# Patient Record
Sex: Female | Born: 1965 | Race: Black or African American | Hispanic: No | Marital: Single | Smoking: Never smoker
Health system: Southern US, Community
[De-identification: ages and names within clinical notes are randomized; demographics above are authoritative.]

## PROBLEM LIST (undated history)

## (undated) ENCOUNTER — Ambulatory Visit (HOSPITAL_COMMUNITY): Admission: EM | Disposition: A | Discharge status: Other Acute Inpatient Hospital | Payer: Self-pay

## (undated) DIAGNOSIS — K219 Gastro-esophageal reflux disease without esophagitis: Secondary | ICD-10-CM

## (undated) HISTORY — PX: ABDOMINAL HYSTERECTOMY: SHX81

---

## 2005-02-07 ENCOUNTER — Emergency Department (HOSPITAL_COMMUNITY): Admission: EM | Admit: 2005-02-07 | Discharge: 2005-02-07 | Payer: Self-pay | Admitting: Family Medicine

## 2005-06-11 ENCOUNTER — Ambulatory Visit: Payer: Self-pay | Admitting: Family Medicine

## 2005-07-24 ENCOUNTER — Emergency Department (HOSPITAL_COMMUNITY): Admission: EM | Admit: 2005-07-24 | Discharge: 2005-07-24 | Payer: Self-pay | Admitting: Family Medicine

## 2005-07-30 ENCOUNTER — Emergency Department (HOSPITAL_COMMUNITY): Admission: EM | Admit: 2005-07-30 | Discharge: 2005-07-30 | Payer: Self-pay | Admitting: Emergency Medicine

## 2005-09-19 ENCOUNTER — Ambulatory Visit: Payer: Self-pay | Admitting: Family Medicine

## 2005-09-25 ENCOUNTER — Inpatient Hospital Stay (HOSPITAL_COMMUNITY): Admission: AD | Admit: 2005-09-25 | Discharge: 2005-09-25 | Payer: Self-pay | Admitting: Obstetrics and Gynecology

## 2005-10-09 ENCOUNTER — Emergency Department (HOSPITAL_COMMUNITY): Admission: EM | Admit: 2005-10-09 | Discharge: 2005-10-09 | Payer: Self-pay | Admitting: *Deleted

## 2005-10-16 ENCOUNTER — Ambulatory Visit: Payer: Self-pay | Admitting: Obstetrics and Gynecology

## 2005-11-15 ENCOUNTER — Emergency Department (HOSPITAL_COMMUNITY): Admission: EM | Admit: 2005-11-15 | Discharge: 2005-11-15 | Payer: Self-pay | Admitting: Family Medicine

## 2005-11-21 ENCOUNTER — Ambulatory Visit (HOSPITAL_COMMUNITY): Admission: RE | Admit: 2005-11-21 | Discharge: 2005-11-21 | Payer: Self-pay | Admitting: Orthopaedic Surgery

## 2005-11-26 ENCOUNTER — Ambulatory Visit: Payer: Self-pay | Admitting: Obstetrics and Gynecology

## 2005-11-26 ENCOUNTER — Inpatient Hospital Stay (HOSPITAL_COMMUNITY): Admission: RE | Admit: 2005-11-26 | Discharge: 2005-11-29 | Payer: Self-pay | Admitting: Obstetrics and Gynecology

## 2005-11-26 ENCOUNTER — Encounter (INDEPENDENT_AMBULATORY_CARE_PROVIDER_SITE_OTHER): Payer: Self-pay | Admitting: Specialist

## 2005-12-07 ENCOUNTER — Ambulatory Visit: Payer: Self-pay | Admitting: Obstetrics & Gynecology

## 2005-12-07 ENCOUNTER — Inpatient Hospital Stay (HOSPITAL_COMMUNITY): Admission: AD | Admit: 2005-12-07 | Discharge: 2005-12-07 | Payer: Self-pay | Admitting: Obstetrics & Gynecology

## 2005-12-11 ENCOUNTER — Ambulatory Visit: Payer: Self-pay | Admitting: Obstetrics and Gynecology

## 2005-12-26 ENCOUNTER — Ambulatory Visit: Payer: Self-pay | Admitting: Obstetrics & Gynecology

## 2006-02-16 ENCOUNTER — Emergency Department (HOSPITAL_COMMUNITY): Admission: EM | Admit: 2006-02-16 | Discharge: 2006-02-16 | Payer: Self-pay | Admitting: Family Medicine

## 2006-08-05 ENCOUNTER — Emergency Department (HOSPITAL_COMMUNITY): Admission: EM | Admit: 2006-08-05 | Discharge: 2006-08-05 | Payer: Self-pay | Admitting: Emergency Medicine

## 2006-09-12 ENCOUNTER — Emergency Department (HOSPITAL_COMMUNITY): Admission: EM | Admit: 2006-09-12 | Discharge: 2006-09-12 | Payer: Self-pay | Admitting: Emergency Medicine

## 2006-11-27 ENCOUNTER — Emergency Department (HOSPITAL_COMMUNITY): Admission: EM | Admit: 2006-11-27 | Discharge: 2006-11-27 | Payer: Self-pay | Admitting: Family Medicine

## 2006-12-11 ENCOUNTER — Emergency Department (HOSPITAL_COMMUNITY): Admission: EM | Admit: 2006-12-11 | Discharge: 2006-12-11 | Payer: Self-pay | Admitting: Emergency Medicine

## 2007-01-08 ENCOUNTER — Emergency Department (HOSPITAL_COMMUNITY): Admission: EM | Admit: 2007-01-08 | Discharge: 2007-01-08 | Payer: Self-pay | Admitting: Emergency Medicine

## 2007-01-16 ENCOUNTER — Emergency Department (HOSPITAL_COMMUNITY): Admission: EM | Admit: 2007-01-16 | Discharge: 2007-01-16 | Payer: Self-pay | Admitting: Family Medicine

## 2007-04-19 ENCOUNTER — Emergency Department (HOSPITAL_COMMUNITY): Admission: EM | Admit: 2007-04-19 | Discharge: 2007-04-19 | Payer: Self-pay | Admitting: Family Medicine

## 2007-09-05 ENCOUNTER — Emergency Department (HOSPITAL_COMMUNITY): Admission: EM | Admit: 2007-09-05 | Discharge: 2007-09-05 | Payer: Self-pay | Admitting: Family Medicine

## 2011-03-02 NOTE — Op Note (Signed)
NAMESONNY, POTH              ACCOUNT NO.:  [id number]   MEDICAL RECORD NO.:  [id number]          PATIENT TYPE:  AMB   LOCATION:  SDS                          FACILITY:  [facility]   PHYSICIAN:  Vanita Panda. Magnus Ivan, M.D.DATE OF BIRTH:  1966-02-25   DATE OF PROCEDURE:  11/21/2005  DATE OF DISCHARGE:  11/21/2005                                 OPERATIVE REPORT   PREOPERATIVE DIAGNOSIS:  Right thumb flexor pollicis longus (FPL) tendon  laceration with questionable nerve injury.   POSTOPERATIVE DIAGNOSIS:  Right thumb flexor pollicis longus tendon  laceration.   PROCEDURE:  Right thumb wound exploration with direct primary repair of  right thumb flexor pollicis longus tendon in zone II.   SURGEON:  Vanita Panda. Magnus Ivan, M.D.   ANESTHESIA:  General.   ANTIBIOTICS:  One gram IV Ancef.   TOURNIQUET TIME:  One hour 27 minutes.   COMPLICATIONS:  None.   INDICATIONS:  Briefly, Ms. Vernie Shanks is a 39-year right-hand-dominant female  who was going to change a light bulb when the light fixture itself broke in  her hand and she sustained a transverse laceration across her thumb between  the MCP and IP joint of the thumb.  She had the inability to flex her thumb,  but she could extend it.  She reported numbness in her thumb tip.  The thumb  tip itself was well-perfused.  The wound was irrigated by the ER staff and  the wound was closed and she was seen in my clinic a couple of days later.  I suspected an FPL tendon injury, which was a complete injury, and possible  nerve injury and thus I set her up for today's surgery.  The risks and  benefits of this were well- explained to her and understood.  She agreed to  proceed with surgery.   PROCEDURE:  After informed consent was obtained, the appropriate right arm  was marked.  Ms. Vernie Shanks was brought to the operating room and placed supine  on the operating table.  General anesthesia was then obtained.  A nonsterile  tourniquet was placed  around her upper arm and then her arm was prepped and  draped with DuraPrep and sterile drapes.  Previous sutures on the thumb were  removed.  I then opened a small transverse incision and found that the FPL  tendon was completely ruptured in a zigzag fashion.  I extended the incision  proximally and distally and especially had to extend the incision into the  palm.  At that point I was able to use a tendon grasper and locate the FPL  tendon within its tendon sheath that had retracted into the palm area.  I  was able to pull this section out and run it through the A1 and A2 pulleys  and I then put a core suture consisting of 3-0 Ethibond suture into the  proximal end of the FPL tendon.  Next, the distal end was dissected out and  it was found to be intact coming out of the A4 pulley.  I put an additional  3-0 Ethibond suture in this end and  then was able to perform a direct  primary repair of the FPL tendon with the 3-0 core sutures.  I next  oversewed the epitendinous aspect of the tendon with interrupted 6-0 Prolene  suture.  The tendon was able to glide through its complete flexion without  interruption.  I next explored the wound and found the artery and vein to be  intact on both sides and the nerve intact as well.  The wound was then  copiously irrigated and the incisions were closed with interrupted 3-0  Prolene suture in the skin.  Of note, an Esmarch had been used to wrap out  the arm and the tourniquet was previously inflated to 250 mm of pressure.  I  let the tourniquet down and hemostasis was obtained and the thumb did pink-  up nicely.  A well-padded plaster splint was then placed on the thumb to  limit motion and the wrist slightly flexed as well as take tension off of  the repair.  The patient was awakened, extubated and taken to the recovery  room in stable condition.           ______________________________  Vanita Panda. Magnus Ivan, M.D.     CYB/MEDQ  D:  11/21/2005   T:  11/22/2005  Job:  [id number]

## 2011-03-02 NOTE — Group Therapy Note (Signed)
NAME:  [name], [name] NO.:  [id number]   MEDICAL RECORD NO.:  [id number]          PATIENT TYPE:  WOC   LOCATION:  [facility]                   FACILITY:  [facility]   PHYSICIAN:  Argentina Donovan, MD        DATE OF BIRTH:  02-Mar-1966   DATE OF SERVICE:                                    CLINIC NOTE   The patient is a 45 year old black female, gravida 4, para 4-0-0-4 the  youngest being 42, who underwent a myomectomy in 1996 and did fairly well  after that.  However, she has had severe low abdominal pain continually with  increasing dysmenorrhea radiating to the upper thighs and back at the time  of her period.  She has some small fibroids and her uterus is slightly  enlarged, but for the most part it is soft, boggy, and gives the impression  that patient probably has adenomyosis.  She was seen in the in the [facility]  recently and was anemic, but not treated, and at [facility] had a positive  chlamydia, for which she states is not treated.  So, today we are going to  put her on iron, have her take Ibuprofen for the pain and put her  Erythromycin for the chlamydia.   PAST HISTORY:  The myomectomy and a cervical disc surgery.  She uses condoms  for contraception.  She is a non-smoker.  She goes to [place] to a pain  clinic to get medication for her neurological pain from the neck surgery,  and they give her OxyContin there.  Apparently, she has written for the  records, but has not been able to receive them as yet.  Other than that, she  is in good health.   IMPRESSION:  1.  Chronic pelvic pain.  2.  Uterine fibroids.  3.  Probable adenomyosis with secondary menometrorrhagia and anemia.           ______________________________  Argentina Donovan, MD     PR/MEDQ  D:  10/16/2005  T:  10/16/2005  Job:  [id number]

## 2011-03-02 NOTE — Group Therapy Note (Signed)
NAMESURY, WENTWORTH NO.:  [id number]   MEDICAL RECORD NO.:  [id number]          PATIENT TYPE:  WOC   [facility]ATION:  [facility]                   FACILITY[facility]  [facility]   PHYSICIAN:  Argentina Donovan, MD        DATE OF BIRTH:  21-Dec-1965   DATE OF SERVICE:                                    CLINIC NOTE   The patient is a 45 year old black female, who underwent a total abdominal  hysterectomy and right salpingo-oophorectomy on November 26, 2005.  She had  a non-significant postoperative course and was seen today for her routine  postoperative checkup.  She has no significant complaints except for some  abdominal gas and we have encouraged her to use some activated charcoal  capsules.  She has a soft, flat, nontender abdomen with a transverse  suprapubic scar that is barely visible at this point and she has no other  positive findings.  We are going to see her back in 2 weeks.  She did go on  Periactin because of an unintentional weight loss and no appetite.  She said  this seems to be working.   DIAGNOSES:  Postoperative 2-week visit post total abdominal hysterectomy and  right salpingo-oophorectomy, satisfactory healing.           ______________________________  Argentina Donovan, MD     PR/MEDQ  D:  12/11/2005  T:  12/11/2005  Job:  [id number]

## 2011-03-02 NOTE — Group Therapy Note (Signed)
NAME:  Catherine, [name] NO.:  [id number]   MEDICAL RECORD NO.:  [id number]          PATIENT TYPE:  WOC   [facility]ATION:  [facility]                   [facility]ACILITY:  [facility]   PHYSICIAN:  Kathlyn Sacramento, M.D.   DATE OF BIRTH:  [birth year]   DATE OF SERVICE:                                    CLINIC NOTE   CHIEF COMPLAINT:  Followup from surgery and followup for unintentional  weight loss.   HISTORY OF PRESENT ILLNESS:  The patient is a 45 year old African-American  female, who had a total abdominal hysterectomy and right salpingo-  oophorectomy on November 26, 2005.  She stated that she had had some  unintentional weight loss and had been placed Periactin for 1 month.  She  said the Periactin worked for 1 month, but since she has stopped the  prescription, she has lost her appetite again.  She does state that she has  a history of depression and she does have a depressed mood.  She had been on  Effexor, Risperdal and Lunesta in the past when she was in [place], but has  not been on them recently.  She stopped them in [place] because of  transportation problems and has not reestablished herself with a  psychiatrist here in [place].  She does state that she is having no pain  or bleeding after the surgery.   PHYSICAL EXAMINATION:  VITAL SIGNS: Temp 98.6, pulse 102, blood pressure  111/61, weight 149.2 and height 5 feet 7 inches.  GENERAL:  A well-developed, well-nourished female in no acute distress.   IMPRESSION:  1.  Depression.  2.  Status post transabdominal hysterectomy with right salpingo-      oophorectomy.   PLAN:  1.  We will have patient referred to behavioral health.  2.  Stable, post surgery.  3.  The patient can follow up as needed.           ______________________________  Kathlyn Sacramento, M.D.     AC/MEDQ  D:  12/26/2005  T:  12/27/2005  Job:  [id number]

## 2011-03-02 NOTE — Discharge Summary (Signed)
[name], [name]              ACCOUNT NO.:  [id number]   MEDICAL RECORD NO.:  [id number]          PATIENT TYPE:  INP   LOCATION:  9318                          [facility]   PHYSICIAN:  Phil D. Okey Dupre, M.D.     DATE OF BIRTH:  02/20/1966   DATE OF ADMISSION:  11/26/2005  DATE OF DISCHARGE:                                 DISCHARGE SUMMARY   The patient, a 45 year old black female, with intractable menometrorrhagia  and pelvic pain, pelvic adhesions, underwent total abdominal hysterectomy  and right salpingo-oophorectomy on the day of admission. Has done extremely  well postoperatively except short episode of acute urinary retention on the  first postoperative day which has resolved. She also had not had a bowel  movement in several days and has since yesterday succeeded in that and feels  very well. She has had a completely afebrile postoperative course. Her  hemoglobin is stable at 8.9. She received 2 units of blood during her  hospitalization, one during surgery and one postoperatively in the recovery  room because of the low hemoglobin at the beginning of the surgery. The  patient has a physical examination without significant abnormal findings.  The wound is clean. The abdomen is soft, flat, nontender. No masses, no  organomegaly. The skin staples were removed before discharge and the patient  with get follow-up in the GYN clinic in 2 weeks. She had been given strict  discharge instructions as to activity, diet and follow-up, and is being  discharged on Percocet, Motrin, Slow Fe for iron replacement, Periactin as  an appetite stimulant.   DISCHARGE DIAGNOSIS:  Pending pathology report which has not returned.           ______________________________  Javier Glazier Okey Dupre, M.D.     PDR/MEDQ  D:  11/29/2005  T:  11/29/2005  Job:  [id number]

## 2011-03-02 NOTE — Op Note (Signed)
NAMEHAGAR, [name]              ACCOUNT NO.:  [id number]   MEDICAL RECORD NO.:  [id number]          PATIENT TYPE:  INP   LOCATION:  9318                          [facility]   PHYSICIAN:  Phil D. Okey Dupre, M.D.     DATE OF BIRTH:  1966/08/06   DATE OF PROCEDURE:  11/26/2005  DATE OF DISCHARGE:                                 OPERATIVE REPORT   PROCEDURE:  Total abdominal hysterectomy and right salpingo-oophorectomy.   SURGEON:  Javier Glazier. Okey Dupre, M.D.   FIRST ASSISTANT:  Shelbie Proctor. Shawnie Pons, M.D.   ESTIMATED BLOOD LOSS:  500 mL.   ANESTHESIA:  General   PREOPERATIVE DIAGNOSES:  1.  Chronic pelvic pain.  2.  Secondary anemia.  3.  Pelvic adhesions.  4.  Dysmenorrhea.  5.  Menometrorrhagia.   POSTOPERATIVE DIAGNOSES:  1.  Chronic pelvic pain.  2.  Secondary anemia.  3.  Pelvic adhesions.  4.  Dysmenorrhea.  5.  Menometrorrhagia.   OPERATIVE FINDINGS:  Uterus was of normal size, shape, somewhat soft in  consistency with marked adhesions of the sigmoid colon to the cul-de-sac and  the area between the uterosacral ligaments.  Also significant adhesions in  the area of the right adnexa with a hemorrhagic right ovarian cyst.  The  left ovary was normal.   DESCRIPTION OF PROCEDURE:  Under satisfactory general anesthesia, with the  patient in the dorsal supine position and Foley catheter in the urinary  bladder, the abdomen was prepped and draped in the usual sterile manner and  entered through a Pfannenstiel incision situated 3 cm above the symphysis  pubis and extending for a total length of 14 cm.  The abdomen was entered by  layers entering the peritoneal cavity.  The above findings were noted.  Sharp dissection was used to dissect the colon away from the posterior wall  of the uterus as well as in between the uterosacral ligaments.  Sharp and  blunt dissection was used to free up the  right adnexa and a few other  adhesions were broken up by blunt dissection and coming through  the  subcutaneous tissue the first doing noticed was there was a significant  amount of oozing.  The uterus was held up with straight Kocher clamps and  were placed across the utero-ovarian ligament down to the round ligament.  The round ligaments were then ligated with 1 chromic catgut sutures dividing  the anterior leaf of broad ligament opened parallel to the uterus and  extending around the anterior superior portion of the cervix.  The bladder  was pushed from the lower portion of the cervix by sharp and blunt  dissection.  Opening was made in the avascular portion of the broad ligament  through which the tie was tied around the utero-ovarian ligament medial to  the ovary and a curved Haney clamp then placed across the pedicle.  The  tissue medially divided and the lateral pedicle religated with 1 chromic  catgut suture ligature.  Uterine vessels were then skeletonized, doubly  clamped, divided and doubly ligated with 1 chromic catgut suture.  The  ligature cardinal was clamped with a straight Kocher clamp, divided and  ligated with 1 chromic catgut suture ligature.  Curved Heaney clamps were  placed across the apex of the vagina just below the cervix which was  dissected away from the apex of the vagina by sharp dissection.  Angle  sutures of 1 chromic catgut were used in each of the lateral vaginal cuff  angles and figure-of-eight used to close the vaginal cuff.  There was  continuous oozing and marked bleeding from the very small pumpers in the  area of the left uterine vessel pedicle which were controlled with hot  cautery or small hemostats with small ties of 3-0 chromic catgut.  Hot  cautery was also used just at the apex of the bladder where there was marked  oozing.  Re-examining the right adnexa because of the marked amount of  scarring and the diagnosis of pelvic pain and adhesions, we decided to  remove the ovary and tube.  The infundibulopelvic ligament was clamped with  a  Heaney clamp, tissue, divided and removed and the lateral pedicle ligated  0 chromic catgut suture ligature.  Small oozing areas were then still  controlled around the vaginal cuff with hot cautery. The area was irrigated.  Upper abdominal viscera was explored and found to be within normal limits.  There was a significant amount of oozing on the surface of the rectus  muscles which was controlled with hot cautery, and the fascia closed with  continuous running 0 Vicryl on an atraumatic needle.  Subcutaneous bleeders  were once again were controlled with hot cautery as there as were many  obvious at this time.  Skin edge approximation with skin staples and a  pressure dressing applied.           ______________________________  Javier Glazier Okey Dupre, M.D.     PDR/MEDQ  D:  11/26/2005  T:  11/26/2005  Job:  [id number]

## 2011-03-02 NOTE — H&P (Signed)
[name], [name]              ACCOUNT NO.:  [id number]   MEDICAL RECORD NO.:  [id number]          PATIENT TYPE:  AMB   LOCATION:  SDS                          FACILITY:  [facility]   PHYSICIAN:  Phil D. Okey Dupre, M.D.     DATE OF BIRTH:  22-Sep-1966   DATE OF ADMISSION:  11/26/2005  DATE OF DISCHARGE:                                HISTORY & PHYSICAL   CHIEF COMPLAINT:  Continual severe abdominal pain with heavy periods.   HISTORY OF PRESENT ILLNESS:  The patient is a 45 year old black female  gravida 4, para 4-0-0-4, youngest child being 38 years old, who underwent a  myomectomy in 1996 and did fairly well after that. However, she has had  severe lower abdominal pain, continually, with increasing dysmenorrhea,  radiating to the upper thighs and back, and heavy periods. She has some  small fibroids in the uterus.  It is slightly enlarged, but for the most  part, it is soft and boggy and gives the impression that the patient has  probably had no myosis. She was seen in the [facility]  recently and was very anemic but not treated. When she was seen in the GYN  clinic, we started her on iron therapy. The patient is being admitted for  total abdominal hysterectomy. We have had a long consult with the patient  today, discussing the possible complications, especially those related to  anesthesia, injury to bowel or urinary tract, especially since she has had  an abdominal procedure previously, and we discussed postoperative hemorrhage  as well as infection. We discussed the followup care after the surgery and  restrictions after she went home.   PAST MEDICAL HISTORY:  She had myomectomy in 1996. She has had 4 vaginal  deliveries. She had 2 cervical disks removed.   SOCIAL HISTORY:  She is a nonsmoker. She takes no illicit drugs and she does  not drink alcohol.   ALLERGIES:  NO KNOWN DRUG ALLERGIES.   MEDICATIONS:  Tylenol p.r.n., occasional oxycodone for her back pain and  neck pain.   FAMILY HISTORY:  Diabetes in her mother. Her mother has high blood pressure,  and her mother's sister had a heart attack.   REVIEW OF SYSTEMS:  Negative except as in history of present illness and the  pain she has secondary to her disk surgery, although she has a vague history  of asthma and occasionally she has had episodes of shortness of breath. Also  has had a history of bruising in the past, but all of her blood work up to  this point has been normal.   PHYSICAL EXAMINATION:  VITAL SIGNS:  Blood pressure 114/62, pulse is 94,  temperature 98.4. Respiratory rate 18 per minute. The patient weighs 155  pounds, and is 5 feet 7 inches tall.  GENERAL:  Well-developed, well-nourished black female in no acute distress.  HEENT:  Within normal limits. Pupils are equal, round, and reactive to light  and accommodation.  NECK:  Supple with no dominant masses. Thyroid is symmetrical.  BACK:  Erect.  BREAST:  Symmetrical with no dominant masses  and no nipple discharge.  LUNGS:  Clear to auscultation and percussion.  HEART:  No murmur. Normal sinus rhythm.  ABDOMEN:  Soft, flat, nontender. No masses or organomegaly.  EXTREMITIES:  Deep tendon reflexes normal. No edema. No varices.  GENITOURINARY:  External genitals normal. Introitus marital. BUS within  normal limits. Vagina clean and well rugated. Cervix is clean and parous.  Uterus is upper limits of normal, boggy in consistency. Somewhat irregular  in shape. The adnexa, no masses noted.  RECTAL:  No masses noted.   IMPRESSION:  Severe disabling dysmenorrhea with menorrhagia and secondary  anemia as well as chronic pelvic pain, uterine fibroids, probable  adenomyosis.   PLAN:  Total abdominal hysterectomy.           ______________________________  Javier Glazier Okey Dupre, M.D.     PDR/MEDQ  D:  11/21/2005  T:  11/21/2005  Job:  [id number]

## 2012-01-09 ENCOUNTER — Emergency Department (HOSPITAL_COMMUNITY)
Admission: EM | Admit: 2012-01-09 | Discharge: 2012-01-09 | Disposition: A | Discharge status: Home / Self Care | Payer: Self-pay | Attending: Emergency Medicine | Admitting: Emergency Medicine

## 2012-01-09 ENCOUNTER — Encounter (HOSPITAL_COMMUNITY): Payer: Self-pay | Admitting: Emergency Medicine

## 2012-01-09 DIAGNOSIS — R0602 Shortness of breath: Secondary | ICD-10-CM | POA: Insufficient documentation

## 2012-01-09 DIAGNOSIS — J069 Acute upper respiratory infection, unspecified: Secondary | ICD-10-CM | POA: Insufficient documentation

## 2012-01-09 HISTORY — DX: Gastro-esophageal reflux disease without esophagitis: K21.9

## 2012-01-09 MED ORDER — ALBUTEROL SULFATE (5 MG/ML) 0.5% IN NEBU
5.0000 mg | INHALATION_SOLUTION | Freq: Once | RESPIRATORY_TRACT | Status: AC
  Administered 2012-01-09: 5 mg via RESPIRATORY_TRACT
  Filled 2012-01-09: qty 1

## 2012-01-09 MED ORDER — IPRATROPIUM BROMIDE 0.02 % IN SOLN
0.5000 mg | Freq: Once | RESPIRATORY_TRACT | Status: AC
  Administered 2012-01-09: 0.5 mg via RESPIRATORY_TRACT
  Filled 2012-01-09: qty 2.5

## 2012-01-09 MED ORDER — ALBUTEROL SULFATE HFA 108 (90 BASE) MCG/ACT IN AERS
2.0000 | INHALATION_SPRAY | RESPIRATORY_TRACT | Status: AC
  Administered 2012-01-09: 2 via RESPIRATORY_TRACT
  Filled 2012-01-09: qty 6.7

## 2012-01-09 NOTE — ED Provider Notes (Signed)
History     CSN: [id number]  Arrival date & time 01/09/12  2130   First MD Initiated Contact with Patient 01/09/12 0805      Chief Complaint  Patient presents with  . Shortness of Breath    HPI Pt was seen at 0810.  Per pt, c/o gradual onset and persistence of constant cough, wheezing and SOB for the past 4 days.  Has been assoc with runny/stuffy nose, sinus and ears congestion.  +multiple others in household with same symptoms.  Pt has been using her home nebs with intermittent relief.  Denies fevers, no abd pain, no back pain, no N/V/D, no rash.    Past Medical History  Diagnosis Date  . Asthma   . GERD (gastroesophageal reflux disease)     Past Surgical History  Procedure Date  . Abdominal hysterectomy      History  Substance Use Topics  . Smoking status: Never Smoker   . Smokeless tobacco: Not on file  . Alcohol Use: No    Review of Systems ROS: Statement: All systems negative except as marked or noted in the HPI; Constitutional: Negative for fever and chills. ; ; Eyes: Negative for eye pain, redness and discharge. ; ; ENMT: Negative for ear pain, hoarseness, sore throat.  +rhinorrhea, nasal congestion, sinus pressure. ; ; Cardiovascular: Negative for chest pain, palpitations, diaphoresis, dyspnea and peripheral edema. ; ; Respiratory: +cough, SOB, "wheezing."  Negative for stridor. ; ; Gastrointestinal: Negative for nausea, vomiting, diarrhea, abdominal pain, blood in stool, hematemesis, jaundice and rectal bleeding. . ; ; Genitourinary: Negative for dysuria, flank pain and hematuria. ; ; Musculoskeletal: Negative for back pain and neck pain. Negative for swelling and trauma.; ; Skin: Negative for pruritus, rash, abrasions, blisters, bruising and skin lesion.; ; Neuro: Negative for headache, lightheadedness and neck stiffness. Negative for weakness, altered level of consciousness , altered mental status, extremity weakness, paresthesias, involuntary movement, seizure and  syncope.     Allergies  Review of patient's allergies indicates no known allergies.  Home Medications  No current outpatient prescriptions on file.  BP 113/52  Pulse 91  Temp(Src) 98.6 F (37 C) (Oral)  Resp 20  SpO2 100%  Physical Exam 0815: Physical examination:  Nursing notes reviewed; Vital signs and O2 SAT reviewed;  Constitutional: Well developed, Well nourished, Well hydrated, In no acute distress; Head:  Normocephalic, atraumatic; Eyes: EOMI, PERRL, No scleral icterus; ENMT: +clear fluid levels behind TM's bilat, +edemetous nasal turbinates bilat with clear rhinorrhea , Mouth and pharynx normal, Mucous membranes moist; Neck: Supple, Full range of motion, No lymphadenopathy; Cardiovascular: Regular rate and rhythm, No murmur or gallop; Respiratory: Breath sounds coarse & equal bilaterally, No wheezes, Speaking full sentences with ease, no retrax or access mm use.  Normal respiratory effort/excursion; Chest: Nontender, Movement normal; Abdomen: Soft, Nontender, Nondistended, Normal bowel sounds; Extremities: Pulses normal, No tenderness, No edema, No calf edema or asymmetry.; Neuro: AA&Ox3, Major CN grossly intact.  No gross focal motor or sensory deficits in extremities.; Skin: Color normal, Warm, Dry, no rash.    ED Course  Procedures   MDM  MDM Reviewed: nursing note and vitals     8:19 AM:  Pt with hx asthma, does not smoke.  Appears URI today; endorses multiple others in house with URI symptoms.  Lungs coarse today, but no wheezing, no resp distress.  Refuses dose of MDI here, requesting "a nebulizer treatment because I know my body."  VSS, Sats 100% R/A.  Will dose neb  and re-assess.  8:52 AM:  Feels improved after neb and wants to go home now.  Lungs CTA, resps easy, Sats remain 100% R/A.  Dx d/w pt.  Questions answered.  Verb understanding, agreeable to d/c home with outpt f/u.         Laray Anger, DO 01/10/12 1827

## 2012-01-09 NOTE — ED Notes (Signed)
Pt reports chest tightness, SOB and wheezing for the last week. Pt afebrile. Denies pain at present. States overnight need for frequent use of inhaler

## 2012-01-09 NOTE — Discharge Instructions (Signed)
RESOURCE GUIDE  Dental Problems  Patients with Medicaid: [facility]                     [facility] [address]                                           [address] Phone:  [phone]                                                  Phone:  [phone]  If unable to pay or uninsured, contact:  [facility] or [facility] to become qualified for the adult dental clinic.  Chronic Pain Problems Contact [facility]  [phone] Patients need to be referred by their primary care doctor.  Insufficient Money for Medicine Contact [employer]:  call "211" or [facility] 271-5999.  No Primary Care Doctor Call [facility]  832-8000 Other agencies that provide inexpensive medical care    [facility]  [phone]    [facility]  [phone]    [facility]  [phone]    [facility]  [phone]    [facility]  [phone]    [facility]  [phone]  Psychological Services [facility]  [phone] [facility]  [phone] [facility]   800 853-5163 (emergency services [phone])  Substance Abuse Resources [facility]  336-882-2125 [facility] 336-784-9470 [facility] 336-285-9073 [facility] 336-845-3988 Residential & Outpatient Substance Abuse Program  800-659-3381  Abuse/Neglect [place] Child Abuse Hotline (336) 641-3795 [place] [facility] Abuse Hotline 800-378-5315 (After Hours)  Emergency Shelter [facility] (336) 271-5985  [facility] (336) 275-9566 [facility] (704) 372-4663  MRSA Hotline #:   832-7006    [place] [facility]  [facility] [facility]     [employer]                          [facility] [address] [place]                       [address]      [address]  [place]                                                [place]                            [place] Phone:  [phone]                                   Phone:  [phone]                 Phone:  [phone]  [facility] Phone:  [phone]    [place] Child Abuse Hotline (336) 342-1394 (336) 342-3537 (After Hours)    Take over the counter sudafed, as directed on packaging, for the next week.  Use over the counter normal saline nasal spray, as instructed in the Emergency Department, several times per day for the next 2 weeks.  Use the albuterol inhaler:  2 to 4 puffs every 4 hours for the next 7 days.  Call your regular medical doctor today to schedule a follow up appointment this week.  Return to the Emergency Department immediately if worsening.  

## 2013-04-22 ENCOUNTER — Other Ambulatory Visit: Payer: Self-pay

## 2018-07-26 ENCOUNTER — Ambulatory Visit (HOSPITAL_COMMUNITY)
Admission: EM | Admit: 2018-07-26 | Discharge: 2018-07-26 | Disposition: A | Discharge status: Home / Self Care | Payer: Worker's Compensation | Attending: Family Medicine | Admitting: Family Medicine

## 2018-07-26 DIAGNOSIS — W57XXXA Bitten or stung by nonvenomous insect and other nonvenomous arthropods, initial encounter: Secondary | ICD-10-CM

## 2018-07-26 DIAGNOSIS — S50861A Insect bite (nonvenomous) of right forearm, initial encounter: Secondary | ICD-10-CM

## 2018-07-26 MED ORDER — TRIAMCINOLONE ACETONIDE 0.1 % EX CREA: 1.0000 | TOPICAL_CREAM | Freq: Two times a day (BID) | CUTANEOUS | 0 refills | Status: DC

## 2018-07-26 NOTE — Discharge Instructions (Addendum)
You may take Benadryl as needed if not working.

## 2018-07-26 NOTE — ED Triage Notes (Signed)
pt states she was working and felt something biting her on her right forearm. Some type of insect bite.

## 2018-07-28 NOTE — ED Provider Notes (Signed)
[facility]   [id number] 07/26/18 Arrival Time: 1741  ASSESSMENT & PLAN:  1. Insect bite of right forearm, initial encounter     Meds ordered this encounter  Medications  . triamcinolone cream (KENALOG) 0.1 %    Sig: Apply 1 application topically 2 (two) times daily.    Dispense:  30 g    Refill:  0   No sign of infection. Discussed. Will follow up with PCP or here if worsening or failing to improve as anticipated. Reviewed expectations re: course of current medical issues. Questions answered. Outlined signs and symptoms indicating need for more acute intervention. Patient verbalized understanding. After Visit Summary given.   SUBJECTIVE:  [name] is a 52 y.o. female who presents with a skin complaint.   Location: R forearm; "small bumps" Onset: abrupt Duration: today; "felt something bite my arm at work today" Associated pruritis? mild Associated pain? none Progression: stable  Drainage? No  Known trigger? questions insect bite New soaps/lotions/topicals/detergents/environmental exposures? No Contacts with similar? No Recent travel? No  Other associated symptoms: none Therapies tried thus far: none Arthralgia or myalgia? none Recent illness? none Fever? none No specific aggravating or alleviating factors reported.  ROS: As per HPI.  OBJECTIVE: Vitals:   07/26/18 1819 07/26/18 1821  BP: 116/66   Pulse: 88   Resp: 18   Temp: 97.8 F (36.6 C)   SpO2: 100%   Weight:  85.7 kg    General appearance: alert; no distress Lungs: clear to auscultation bilaterally Heart: regular rate and rhythm Extremities: no edema Skin: warm and dry; signs of infection: no; over R elbow are a few solitary erythematous nodules, each <1cm in size; non-tender; without fluctuance; elbow with FROM and without swelling Psychological: alert and cooperative; normal mood and affect  No Known Allergies  Past Medical History:  Diagnosis Date  . Asthma   . GERD  (gastroesophageal reflux disease)    Social History   Socioeconomic History  . Marital status: Single    Spouse name: Not on file  . Number of children: Not on file  . Years of education: Not on file  . Highest education level: Not on file  Occupational History  . Not on file  Social Needs  . Financial resource strain: Not on file  . Food insecurity:    Worry: Not on file    Inability: Not on file  . Transportation needs:    Medical: Not on file    Non-medical: Not on file  Tobacco Use  . Smoking status: Never Smoker  Substance and Sexual Activity  . Alcohol use: No  . Drug use: No  . Sexual activity: Never  Lifestyle  . Physical activity:    Days per week: Not on file    Minutes per session: Not on file  . Stress: Not on file  Relationships  . Social connections:    Talks on phone: Not on file    Gets together: Not on file    Attends religious service: Not on file    Active member of club or organization: Not on file    Attends meetings of clubs or organizations: Not on file    Relationship status: Not on file  . Intimate partner violence:    Fear of current or ex partner: Not on file    Emotionally abused: Not on file    Physically abused: Not on file    Forced sexual activity: Not on file  Other Topics Concern  .  Not on file  Social History Narrative  . Not on file   No family history on file. Past Surgical History:  Procedure Laterality Date  . ABDOMINAL HYSTERECTOMY       Mardella Layman, MD 07/28/18 [phone]

## 2018-12-03 ENCOUNTER — Other Ambulatory Visit: Payer: Self-pay

## 2018-12-03 ENCOUNTER — Ambulatory Visit (HOSPITAL_COMMUNITY)
Admission: EM | Admit: 2018-12-03 | Discharge: 2018-12-03 | Disposition: A | Discharge status: Home / Self Care | Payer: Self-pay | Attending: Family Medicine | Admitting: Family Medicine

## 2018-12-03 ENCOUNTER — Encounter (HOSPITAL_COMMUNITY): Payer: Self-pay

## 2018-12-03 DIAGNOSIS — J40 Bronchitis, not specified as acute or chronic: Secondary | ICD-10-CM

## 2018-12-03 MED ORDER — PREDNISONE 10 MG PO TABS: 40.0000 mg | ORAL_TABLET | Freq: Every day | ORAL | 0 refills | Status: AC

## 2018-12-03 MED ORDER — BENZONATATE 100 MG PO CAPS: 100.0000 mg | ORAL_CAPSULE | Freq: Three times a day (TID) | ORAL | 0 refills | Status: DC

## 2018-12-03 NOTE — ED Provider Notes (Signed)
[facility]    CSN: [id number] Arrival date & time: 12/03/18  0850     History   Chief Complaint Chief Complaint  Patient presents with  . Asthma    HPI [name] is a 53 y.o. female.   Patient is a 53 year old female presents with 2 days of cough, congestion, body aches.  Her symptoms of been constant and worsening with increased wheezing.  She has been using her nebulizer machine for her symptoms.  She is not to take anything for cough.  Reports that she has had multiple sick contacts at work.  Denies any chest pain, shortness of breath.  No recent traveling. Last breathing treatment this am. She does not smoke.   ROS per HPI      Past Medical History:  Diagnosis Date  . Asthma   . GERD (gastroesophageal reflux disease)     There are no active problems to display for this patient.   Past Surgical History:  Procedure Laterality Date  . ABDOMINAL HYSTERECTOMY      OB History   No obstetric history on file.      Home Medications    Prior to Admission medications   Medication Sig Start Date End Date Taking? Authorizing Provider  albuterol (PROVENTIL) (2.5 MG/3ML) 0.083% nebulizer solution Take 2.5 mg by nebulization every 4 (four) hours as needed. For shortness of breath    [provider]  benzonatate (TESSALON) 100 MG capsule Take 1 capsule (100 mg total) by mouth every 8 (eight) hours. 12/03/18   Dahlia Byes A, NP  predniSONE (DELTASONE) 10 MG tablet Take 4 tablets (40 mg total) by mouth daily for 5 days. 12/03/18 12/08/18  Dahlia Byes A, NP  triamcinolone cream (KENALOG) 0.1 % Apply 1 application topically 2 (two) times daily. 07/26/18   Mardella Layman, MD    Family History No family history on file.  Social History Social History   Tobacco Use  . Smoking status: Never Smoker  . Smokeless tobacco: Never Used  Substance Use Topics  . Alcohol use: No  . Drug use: No     Allergies   Patient has no known  allergies.   Review of Systems Review of Systems   Physical Exam Triage Vital Signs ED Triage Vitals  Enc Vitals Group     BP 12/03/18 0913 121/77     Pulse Rate 12/03/18 0913 97     Resp 12/03/18 0913 18     Temp 12/03/18 0913 99.1 F (37.3 C)     Temp Source 12/03/18 0913 Oral     SpO2 12/03/18 0913 99 %     Weight 12/03/18 0934 185 lb (83.9 kg)     Height --      Head Circumference --      Peak Flow --      Pain Score 12/03/18 0933 8     Pain Loc --      Pain Edu? --      Excl. in GC? --    No data found.  Updated Vital Signs BP 121/77 (BP Location: Left Arm)   Pulse 97   Temp 99.1 F (37.3 C) (Oral)   Resp 18   Wt 185 lb (83.9 kg)   SpO2 99%   Visual Acuity Right Eye Distance:   Left Eye Distance:   Bilateral Distance:    Right Eye Near:   Left Eye Near:    Bilateral Near:     Physical Exam Vitals signs and  nursing note reviewed.  Constitutional:      General: She is not in acute distress.    Appearance: Normal appearance. She is well-developed. She is not ill-appearing.  HENT:     Head: Normocephalic and atraumatic.     Right Ear: Tympanic membrane and ear canal normal.     Left Ear: Tympanic membrane and ear canal normal.     Mouth/Throat:     Pharynx: Oropharynx is clear.  Eyes:     Conjunctiva/sclera: Conjunctivae normal.  Neck:     Musculoskeletal: Neck supple.  Cardiovascular:     Rate and Rhythm: Normal rate and regular rhythm.     Heart sounds: No murmur.  Pulmonary:     Effort: Pulmonary effort is normal. No respiratory distress.     Comments: Decreased lung sounds in all fields with mild diffuse expiratory wheezing.  Abdominal:     Palpations: Abdomen is soft.     Tenderness: There is no abdominal tenderness.  Skin:    General: Skin is warm and dry.  Neurological:     Mental Status: She is alert.      UC Treatments / Results  Labs (all labs ordered are listed, but only abnormal results are displayed) Labs Reviewed - No  data to display  EKG None  Radiology No results found.  Procedures Procedures (including critical care time)  Medications Ordered in UC Medications - No data to display  Initial Impression / Assessment and Plan / UC Course  I have reviewed the triage vital signs and the nursing notes.  Pertinent labs & imaging results that were available during my care of the patient were reviewed by me and considered in my medical decision making (see chart for details).     Bronchitis- will treat with prednisone taper and tessalon pearls for cough. Follow up as needed for continued or worsening symptoms  Final Clinical Impressions(s) / UC Diagnoses   Final diagnoses:  Bronchitis     Discharge Instructions     We will go ahead and treat you for bronchitis Prednisone daily for the next 5 days Use your nebulizer machine as needed every 6 hours Tessalon Perles for cough Follow up as needed for continued or worsening symptoms     ED Prescriptions    Medication Sig Dispense Auth. Provider   predniSONE (DELTASONE) 10 MG tablet Take 4 tablets (40 mg total) by mouth daily for 5 days. 20 tablet Darius Lundberg A, NP   benzonatate (TESSALON) 100 MG capsule Take 1 capsule (100 mg total) by mouth every 8 (eight) hours. 21 capsule Dahlia Byes A, NP     Controlled Substance Prescriptions [place] Controlled Substance Registry consulted? Not Applicable   Janace Aris, NP 12/03/18 [phone]

## 2018-12-03 NOTE — Discharge Instructions (Signed)
We will go ahead and treat you for bronchitis Prednisone daily for the next 5 days Use your nebulizer machine as needed every 6 hours Tessalon Perles for cough Follow up as needed for continued or worsening symptoms

## 2018-12-03 NOTE — ED Triage Notes (Signed)
Pt cc asthma flare, coughing and headache  x 2 days.

## 2018-12-07 ENCOUNTER — Emergency Department (HOSPITAL_COMMUNITY)

## 2018-12-07 ENCOUNTER — Other Ambulatory Visit: Payer: Self-pay

## 2018-12-07 ENCOUNTER — Encounter (HOSPITAL_COMMUNITY): Payer: Self-pay

## 2018-12-07 ENCOUNTER — Emergency Department (HOSPITAL_COMMUNITY)
Admission: EM | Admit: 2018-12-07 | Discharge: 2018-12-07 | Disposition: A | Discharge status: Home / Self Care | Attending: Emergency Medicine | Admitting: Emergency Medicine

## 2018-12-07 DIAGNOSIS — J45909 Unspecified asthma, uncomplicated: Secondary | ICD-10-CM | POA: Diagnosis not present

## 2018-12-07 DIAGNOSIS — Y990 Civilian activity done for income or pay: Secondary | ICD-10-CM | POA: Diagnosis not present

## 2018-12-07 DIAGNOSIS — Y9389 Activity, other specified: Secondary | ICD-10-CM | POA: Insufficient documentation

## 2018-12-07 DIAGNOSIS — W010XXA Fall on same level from slipping, tripping and stumbling without subsequent striking against object, initial encounter: Secondary | ICD-10-CM | POA: Insufficient documentation

## 2018-12-07 DIAGNOSIS — S6992XA Unspecified injury of left wrist, hand and finger(s), initial encounter: Secondary | ICD-10-CM | POA: Diagnosis present

## 2018-12-07 DIAGNOSIS — Y929 Unspecified place or not applicable: Secondary | ICD-10-CM | POA: Diagnosis not present

## 2018-12-07 DIAGNOSIS — S6392XA Sprain of unspecified part of left wrist and hand, initial encounter: Secondary | ICD-10-CM

## 2018-12-07 DIAGNOSIS — S63502A Unspecified sprain of left wrist, initial encounter: Secondary | ICD-10-CM | POA: Diagnosis not present

## 2018-12-07 DIAGNOSIS — W19XXXA Unspecified fall, initial encounter: Secondary | ICD-10-CM

## 2018-12-07 MED ORDER — NAPROXEN 500 MG PO TABS: 500.0000 mg | ORAL_TABLET | Freq: Two times a day (BID) | ORAL | 0 refills | Status: DC

## 2018-12-07 NOTE — Discharge Instructions (Addendum)
Thank you for allowing me to care for you today. Please return to the emergency department if you have new or worsening symptoms. Take your medications as instructed.  ° °

## 2018-12-07 NOTE — ED Triage Notes (Signed)
States fell at work and tripped over rug and now left hand/wrist pain and right elbow pain voiced no LOC voiced no other complaints of pain voiced.

## 2018-12-07 NOTE — ED Provider Notes (Signed)
[facility] Provider Note   CSN: [id number] Arrival date & time: 12/07/18  1958    History   Chief Complaint Chief Complaint  Patient presents with  . Fall  . Hand Pain    HPI [name] is a 53 y.o. female.     53 year old African-American female with past medical history of GERD and asthma presents the emergency department for pain in the left upper quadrant after a fall at work today.  She reports that she was at work when she tripped over a rug.  She fell to the ground with outstretched hands.  Reports that she has an abrasion to the right forearm and pain to the left hand and wrist.  Denies hitting head or passing out.  No treatment prior to arrival.  No other injuries.     Past Medical History:  Diagnosis Date  . Asthma   . GERD (gastroesophageal reflux disease)     There are no active problems to display for this patient.   Past Surgical History:  Procedure Laterality Date  . ABDOMINAL HYSTERECTOMY       OB History   No obstetric history on file.      Home Medications    Prior to Admission medications   Medication Sig Start Date End Date Taking? Authorizing Provider  albuterol (PROVENTIL) (2.5 MG/3ML) 0.083% nebulizer solution Take 2.5 mg by nebulization every 4 (four) hours as needed. For shortness of breath    [provider]  benzonatate (TESSALON) 100 MG capsule Take 1 capsule (100 mg total) by mouth every 8 (eight) hours. 12/03/18   Dahlia Byes A, NP  naproxen (NAPROSYN) 500 MG tablet Take 1 tablet (500 mg total) by mouth 2 (two) times daily. 12/07/18   Arlyn Dunning, PA-C  predniSONE (DELTASONE) 10 MG tablet Take 4 tablets (40 mg total) by mouth daily for 5 days. 12/03/18 12/08/18  Dahlia Byes A, NP  triamcinolone cream (KENALOG) 0.1 % Apply 1 application topically 2 (two) times daily. 07/26/18   Mardella Layman, MD    Family History History reviewed. No pertinent family history.  Social  History Social History   Tobacco Use  . Smoking status: Never Smoker  . Smokeless tobacco: Never Used  Substance Use Topics  . Alcohol use: No  . Drug use: No     Allergies   Patient has no known allergies.   Review of Systems Review of Systems  Eyes: Negative for visual disturbance.  Respiratory: Negative for cough and shortness of breath.   Gastrointestinal: Negative for nausea and vomiting.  Musculoskeletal: Positive for arthralgias and joint swelling. Negative for back pain, gait problem, myalgias, neck pain and neck stiffness.  Skin: Positive for wound. Negative for rash.  Allergic/Immunologic: Negative for immunocompromised state.  Neurological: Negative for dizziness, light-headedness and headaches.  Hematological: Does not bruise/bleed easily.     Physical Exam Updated Vital Signs BP 136/72 (BP Location: Left Arm)   Pulse 72   Temp 97.9 F (36.6 C) (Oral)   Resp 18   Ht 5\' 5"  (1.651 m)   Wt 83.9 kg   SpO2 97%   BMI 30.79 kg/m   Physical Exam Vitals signs and nursing note reviewed.  Constitutional:      Appearance: Normal appearance.  HENT:     Head: Normocephalic.  Eyes:     Conjunctiva/sclera: Conjunctivae normal.  Pulmonary:     Effort: Pulmonary effort is normal.  Musculoskeletal:     Right elbow: Normal.  Left elbow: Normal.     Right wrist: Normal.     Left wrist: She exhibits tenderness and swelling (mild dorsal). She exhibits normal range of motion, no bony tenderness (no snuffbox tenderness), no effusion, no crepitus, no deformity and no laceration.     Right forearm: She exhibits no tenderness, no bony tenderness, no swelling and no edema.       Arms:     Right hand: She exhibits tenderness and swelling. She exhibits no bony tenderness, no deformity and no laceration. Normal sensation noted. Normal strength noted.       Hands:  Skin:    General: Skin is warm and dry.  Neurological:     Mental Status: She is alert.  Psychiatric:         Mood and Affect: Mood normal.      ED Treatments / Results  Labs (all labs ordered are listed, but only abnormal results are displayed) Labs Reviewed - No data to display  EKG None  Radiology Dg Wrist Complete Left  Result Date: 12/07/2018 CLINICAL DATA:  Fall.  Left wrist and hand pain. EXAM: LEFT WRIST - COMPLETE 3+ VIEW COMPARISON:  Hand series performed today. FINDINGS: There is no evidence of fracture or dislocation. There is no evidence of arthropathy or other focal bone abnormality. Soft tissues are unremarkable. IMPRESSION: Negative. Electronically Signed   By: Charlett Nose M.D.   On: 12/07/2018 21:55   Dg Hand Complete Left  Result Date: 12/07/2018 CLINICAL DATA:  Fall.  Left hand and wrist pain. EXAM: LEFT HAND - COMPLETE 3+ VIEW COMPARISON:  None. FINDINGS: No acute bony abnormality. Specifically, no fracture, subluxation, or dislocation. Soft tissues are intact. Joint spaces maintained. IMPRESSION: No acute bony abnormality. Electronically Signed   By: Charlett Nose M.D.   On: 12/07/2018 21:55    Procedures Procedures (including critical care time)  Medications Ordered in ED Medications - No data to display   Initial Impression / Assessment and Plan / ED Course  I have reviewed the triage vital signs and the nursing notes.  Pertinent labs & imaging results that were available during my care of the patient were reviewed by me and considered in my medical decision making (see chart for details).        Based on review of vitals, medical screening exam, lab work and/or imaging, there does not appear to be an acute, emergent etiology for the patient's symptoms. Counseled pt on good return precautions and encouraged both PCP and ED follow-up as needed.  Prior to discharge, I also discussed incidental imaging findings with patient in detail and advised appropriate, recommended follow-up in detail.  Clinical Impression: 1. Fall, initial encounter   2. Sprain of  left wrist, initial encounter   3. Hand sprain, left, initial encounter     Disposition: Discharge   This note was prepared with assistance of Dragon voice recognition software. Occasional wrong-word or sound-a-like substitutions may have occurred due to the inherent limitations of voice recognition software.   Final Clinical Impressions(s) / ED Diagnoses   Final diagnoses:  Fall, initial encounter  Sprain of left wrist, initial encounter  Hand sprain, left, initial encounter    ED Discharge Orders         Ordered    naproxen (NAPROSYN) 500 MG tablet  2 times daily     12/07/18 2210           Jeral Pinch 12/07/18 2211    Tegeler, Canary Brim, MD  12/08/18 0053  

## 2018-12-17 ENCOUNTER — Other Ambulatory Visit: Payer: Self-pay | Admitting: Family Medicine

## 2018-12-17 ENCOUNTER — Ambulatory Visit: Payer: Self-pay

## 2018-12-17 DIAGNOSIS — M79632 Pain in left forearm: Secondary | ICD-10-CM

## 2018-12-17 DIAGNOSIS — M79642 Pain in left hand: Secondary | ICD-10-CM

## 2019-05-19 ENCOUNTER — Encounter (HOSPITAL_COMMUNITY): Payer: Self-pay

## 2019-05-19 ENCOUNTER — Emergency Department (HOSPITAL_COMMUNITY): Payer: No Typology Code available for payment source

## 2019-05-19 ENCOUNTER — Emergency Department (HOSPITAL_COMMUNITY)
Admission: EM | Admit: 2019-05-19 | Discharge: 2019-05-19 | Disposition: A | Discharge status: Home / Self Care | Payer: No Typology Code available for payment source | Attending: Emergency Medicine | Admitting: Emergency Medicine

## 2019-05-19 ENCOUNTER — Other Ambulatory Visit: Payer: Self-pay

## 2019-05-19 DIAGNOSIS — S39012A Strain of muscle, fascia and tendon of lower back, initial encounter: Secondary | ICD-10-CM

## 2019-05-19 DIAGNOSIS — S199XXA Unspecified injury of neck, initial encounter: Secondary | ICD-10-CM | POA: Diagnosis present

## 2019-05-19 DIAGNOSIS — M5010 Cervical disc disorder with radiculopathy, unspecified cervical region: Secondary | ICD-10-CM | POA: Insufficient documentation

## 2019-05-19 DIAGNOSIS — S161XXA Strain of muscle, fascia and tendon at neck level, initial encounter: Secondary | ICD-10-CM

## 2019-05-19 DIAGNOSIS — Y92410 Unspecified street and highway as the place of occurrence of the external cause: Secondary | ICD-10-CM | POA: Insufficient documentation

## 2019-05-19 DIAGNOSIS — Y999 Unspecified external cause status: Secondary | ICD-10-CM | POA: Insufficient documentation

## 2019-05-19 DIAGNOSIS — R0789 Other chest pain: Secondary | ICD-10-CM | POA: Diagnosis not present

## 2019-05-19 DIAGNOSIS — Y93I9 Activity, other involving external motion: Secondary | ICD-10-CM | POA: Diagnosis not present

## 2019-05-19 DIAGNOSIS — M5090 Cervical disc disorder, unspecified, unspecified cervical region: Secondary | ICD-10-CM

## 2019-05-19 DIAGNOSIS — J45909 Unspecified asthma, uncomplicated: Secondary | ICD-10-CM | POA: Diagnosis not present

## 2019-05-19 MED ORDER — DICLOFENAC SODIUM 1 % TD GEL: 4.0000 g | Freq: Four times a day (QID) | TRANSDERMAL | 0 refills | Status: DC | PRN

## 2019-05-19 MED ORDER — METHOCARBAMOL 500 MG PO TABS
750.0000 mg | ORAL_TABLET | Freq: Once | ORAL | Status: AC
  Administered 2019-05-19: 750 mg via ORAL
  Filled 2019-05-19: qty 2

## 2019-05-19 MED ORDER — METHOCARBAMOL 500 MG PO TABS: 500.0000 mg | ORAL_TABLET | Freq: Three times a day (TID) | ORAL | 0 refills | Status: DC | PRN
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
## 2019-05-19 NOTE — ED Notes (Signed)
Patient verbalizes understanding of discharge instructions. Opportunity for questioning and answers were provided. Pt discharged from ED. 
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
## 2019-05-19 NOTE — Discharge Instructions (Signed)

## 2019-08-11 ENCOUNTER — Other Ambulatory Visit: Payer: Self-pay

## 2019-08-11 ENCOUNTER — Ambulatory Visit (HOSPITAL_COMMUNITY)
Admission: EM | Admit: 2019-08-11 | Discharge: 2019-08-11 | Disposition: A | Discharge status: Home / Self Care | Payer: Managed Care, Other (non HMO) | Attending: Emergency Medicine | Admitting: Emergency Medicine

## 2019-08-11 ENCOUNTER — Encounter (HOSPITAL_COMMUNITY): Payer: Self-pay

## 2019-08-11 DIAGNOSIS — J45901 Unspecified asthma with (acute) exacerbation: Secondary | ICD-10-CM

## 2019-08-11 DIAGNOSIS — R42 Dizziness and giddiness: Secondary | ICD-10-CM

## 2019-08-11 DIAGNOSIS — R0789 Other chest pain: Secondary | ICD-10-CM | POA: Diagnosis not present

## 2019-08-11 DIAGNOSIS — H9391 Unspecified disorder of right ear: Secondary | ICD-10-CM

## 2019-08-11 DIAGNOSIS — H8111 Benign paroxysmal vertigo, right ear: Secondary | ICD-10-CM

## 2019-08-11 MED ORDER — HYDROCORTISONE-ACETIC ACID 1-2 % OT SOLN: 4.0000 [drp] | Freq: Two times a day (BID) | OTIC | 0 refills | Status: DC

## 2019-08-11 MED ORDER — MECLIZINE HCL 25 MG PO TABS: 25.0000 mg | ORAL_TABLET | Freq: Three times a day (TID) | ORAL | 0 refills | Status: DC | PRN

## 2019-08-11 MED ORDER — FLUTICASONE-SALMETEROL 250-50 MCG/DOSE IN AEPB: 1.0000 | INHALATION_SPRAY | Freq: Two times a day (BID) | RESPIRATORY_TRACT | 0 refills | Status: AC

## 2019-08-11 MED ORDER — ALBUTEROL SULFATE (2.5 MG/3ML) 0.083% IN NEBU: 2.5000 mg | INHALATION_SOLUTION | RESPIRATORY_TRACT | 0 refills | Status: DC | PRN

## 2019-08-11 MED ORDER — AEROCHAMBER PLUS MISC: 2 refills | Status: AC

## 2019-08-11 MED ORDER — FLUTICASONE PROPIONATE HFA 110 MCG/ACT IN AERO: 2.0000 | INHALATION_SPRAY | Freq: Two times a day (BID) | RESPIRATORY_TRACT | 0 refills | Status: DC

## 2019-08-11 MED ORDER — ALBUTEROL SULFATE HFA 108 (90 BASE) MCG/ACT IN AERS: 1.0000 | INHALATION_SPRAY | Freq: Four times a day (QID) | RESPIRATORY_TRACT | 0 refills | Status: DC | PRN

## 2019-08-11 MED ORDER — PREDNISONE 10 MG (21) PO TBPK: ORAL_TABLET | ORAL | 0 refills | Status: DC
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
## 2019-08-11 NOTE — Discharge Instructions (Addendum)
2 puffs from your albuterol inhaler or nebulizer treatment every 4 hours for the next 2 days, then every 6 hours for 2 days after that, then you may use it as needed.  Use a spacer with all of your inhalers.  E[place]rop to help with the itching.  Meclizine for the vertigo.  Look up on YouTube how to do the right-sided Epley maneuver.  This will help cure the vertigo.  You need to follow-up with an ear nose throat specialist for your hearing loss.
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
## 2019-12-13 ENCOUNTER — Ambulatory Visit (INDEPENDENT_AMBULATORY_CARE_PROVIDER_SITE_OTHER): Payer: Managed Care, Other (non HMO)

## 2019-12-13 ENCOUNTER — Ambulatory Visit (HOSPITAL_COMMUNITY)
Admission: EM | Admit: 2019-12-13 | Discharge: 2019-12-13 | Disposition: A | Discharge status: Home / Self Care | Payer: Managed Care, Other (non HMO) | Attending: Family Medicine | Admitting: Family Medicine

## 2019-12-13 ENCOUNTER — Encounter (HOSPITAL_COMMUNITY): Payer: Self-pay

## 2019-12-13 ENCOUNTER — Other Ambulatory Visit: Payer: Self-pay

## 2019-12-13 DIAGNOSIS — M5010 Cervical disc disorder with radiculopathy, unspecified cervical region: Secondary | ICD-10-CM

## 2019-12-13 DIAGNOSIS — R0789 Other chest pain: Secondary | ICD-10-CM

## 2019-12-13 DIAGNOSIS — J45909 Unspecified asthma, uncomplicated: Secondary | ICD-10-CM | POA: Diagnosis present

## 2019-12-13 DIAGNOSIS — Z9109 Other allergy status, other than to drugs and biological substances: Secondary | ICD-10-CM | POA: Diagnosis present

## 2019-12-13 DIAGNOSIS — Z21 Asymptomatic human immunodeficiency virus [HIV] infection status: Secondary | ICD-10-CM | POA: Diagnosis present

## 2019-12-13 DIAGNOSIS — M4317 Spondylolisthesis, lumbosacral region: Secondary | ICD-10-CM

## 2019-12-13 LAB — POCT URINALYSIS DIP (DEVICE)
Bilirubin Urine: NEGATIVE
Glucose, UA: NEGATIVE mg/dL
Ketones, ur: NEGATIVE mg/dL
Nitrite: NEGATIVE
Protein, ur: NEGATIVE mg/dL
Specific Gravity, Urine: 1.025 (ref 1.005–1.030)
Urobilinogen, UA: 1 mg/dL (ref 0.0–1.0)
pH: 6.5 (ref 5.0–8.0)

## 2019-12-13 MED ORDER — KETOROLAC TROMETHAMINE 30 MG/ML IJ SOLN
INTRAMUSCULAR | Status: AC
  Filled 2019-12-13: qty 1

## 2019-12-13 MED ORDER — TIZANIDINE HCL 4 MG PO TABS: 4.0000 mg | ORAL_TABLET | Freq: Four times a day (QID) | ORAL | 0 refills | Status: DC | PRN

## 2019-12-13 MED ORDER — METHYLPREDNISOLONE 4 MG PO TBPK: ORAL_TABLET | ORAL | 0 refills | Status: DC

## 2019-12-13 MED ORDER — KETOROLAC TROMETHAMINE 30 MG/ML IJ SOLN
30.0000 mg | Freq: Once | INTRAMUSCULAR | Status: AC
  Administered 2019-12-13: 30 mg via INTRAMUSCULAR

## 2019-12-13 MED ORDER — HYDROCODONE-ACETAMINOPHEN 7.5-325 MG PO TABS: 1.0000 | ORAL_TABLET | Freq: Four times a day (QID) | ORAL | 0 refills | Status: DC | PRN

## 2019-12-13 NOTE — Discharge Instructions (Signed)
Take the medrol dosepak as directed Take all of day one today This is a steroid, anti inflammatory medication Take the pain medicine and muscle relaxer as needed Do not drive on the pain medicine Call your doctor if not better in a few days

## 2019-12-13 NOTE — ED Triage Notes (Signed)
Pt state she has lower back pain x 2 days.

## 2019-12-13 NOTE — ED Provider Notes (Signed)
[facility]    CSN: [id number] Arrival date & time: 12/13/19  1156      History   Chief Complaint Chief Complaint  Patient presents with  . Back Pain    HPI [name] is a 54 y.o. female.   HPI   Patient has pain in her neck and shoulder blade area that is quite severe.  It started suddenly yesterday.  It hurts with deep breath, cough or sneeze.  Hurts with movement.  She has trouble leaning back in a chair, if her back touches anything it is painful.  She has trouble even with clothes rubbing on her back.  She feels slightly short of breath.  At first she thought it was gas and took some ginger ale and Gas-X.  This did not help her. She does have underlying asthma.  She does not have any wheezing.  She has not been using her inhalers.  She is not a smoker. She denies any known neck arms or disc disease.  No numbness or weakness in the arms. Interestingly when x rays arrive it shows old cervical fusion.  She states this was an injury in 2010  Past Medical History:  Diagnosis Date  . Asthma   . GERD (gastroesophageal reflux disease)     Patient Active Problem List   Diagnosis Date Noted  . HIV positive (HCC) 12/13/2019  . Asthma, chronic 12/13/2019  . Environmental allergies 12/13/2019  . Spondylolisthesis at L5-S1 level 12/13/2019    Past Surgical History:  Procedure Laterality Date  . ABDOMINAL HYSTERECTOMY      OB History   No obstetric history on file.      Home Medications    Prior to Admission medications   Medication Sig Start Date End Date Taking? Authorizing Provider  abacavir-dolutegravir-lamiVUDine (TRIUMEQ) 600-50-300 MG tablet Take by mouth. 11/29/16   [provider]  acetic acid-hydrocortisone (VOSOL-HC) OTIC solution Place 4 drops into the right ear 2 (two) times daily. 08/11/19   Domenick Gong, MD  albuterol (PROVENTIL) (2.5 MG/3ML) 0.083% nebulizer solution Take 3 mLs (2.5 mg total) by nebulization every 4  (four) hours as needed. For shortness of breath 08/11/19   Domenick Gong, MD  albuterol (VENTOLIN HFA) 108 (90 Base) MCG/ACT inhaler Inhale 1-2 puffs into the lungs every 6 (six) hours as needed for wheezing or shortness of breath. 08/11/19   Domenick Gong, MD  fluticasone (FLOVENT HFA) 110 MCG/ACT inhaler Inhale 2 puffs into the lungs 2 (two) times daily. 08/11/19   Domenick Gong, MD  Fluticasone-Salmeterol (ADVAIR) 250-50 MCG/DOSE AEPB Inhale 1 puff into the lungs every 12 (twelve) hours. 08/11/19   Domenick Gong, MD  HYDROcodone-acetaminophen (NORCO) 7.5-325 MG tablet Take 1 tablet by mouth every 6 (six) hours as needed for moderate pain. 12/13/19   Eustace Moore, MD  meclizine (ANTIVERT) 25 MG tablet Take 1 tablet (25 mg total) by mouth 3 (three) times daily as needed for dizziness. 08/11/19   Domenick Gong, MD  methylPREDNISolone (MEDROL DOSEPAK) 4 MG TBPK tablet TAD 12/13/19   Eustace Moore, MD  naproxen (NAPROSYN) 500 MG tablet Take 1 tablet (500 mg total) by mouth 2 (two) times daily. 12/07/18   Arlyn Dunning, PA-C  predniSONE (STERAPRED UNI-PAK 21 TAB) 10 MG (21) TBPK tablet Dispense one 6 day pack. Take as directed with food. 08/11/19   Domenick Gong, MD  Spacer/Aero-Holding Chambers (AEROCHAMBER PLUS) inhaler Use as instructed 08/11/19   Domenick Gong, MD  tiZANidine (ZANAFLEX) 4 MG  tablet Take 1-2 tablets (4-8 mg total) by mouth every 6 (six) hours as needed for muscle spasms. 12/13/19   Eustace Moore, MD  triamcinolone cream (KENALOG) 0.1 % Apply 1 application topically 2 (two) times daily. 07/26/18   Mardella Layman, MD    Family History History reviewed. No pertinent family history.  Social History Social History   Tobacco Use  . Smoking status: Never Smoker  . Smokeless tobacco: Never Used  Substance Use Topics  . Alcohol use: No  . Drug use: No     Allergies   Patient has no known allergies.   Review of Systems Review of  Systems  Respiratory: Positive for chest tightness and shortness of breath.   Cardiovascular: Positive for chest pain.  Musculoskeletal: Positive for back pain, neck pain and neck stiffness.     Physical Exam Triage Vital Signs ED Triage Vitals  Enc Vitals Group     BP 12/13/19 1307 135/76     Pulse Rate 12/13/19 1307 85     Resp 12/13/19 1307 18     Temp 12/13/19 1304 98.1 F (36.7 C)     Temp src --      SpO2 12/13/19 1307 100 %     Weight 12/13/19 1305 184 lb (83.5 kg)     Height --      Head Circumference --      Peak Flow --      Pain Score 12/13/19 1305 10     Pain Loc --      Pain Edu? --      Excl. in GC? --    No data found.  Updated Vital Signs BP 135/76 (BP Location: Right Arm)   Pulse 85   Temp 98.1 F (36.7 C)   Resp 18   Wt 83.5 kg   SpO2 100%   BMI 27.98 kg/m   Physical Exam Constitutional:      General: She is not in acute distress.    Appearance: She is well-developed.     Comments: Acutely uncomfortable.  Guarded slow movements  HENT:     Head: Normocephalic and atraumatic.     Mouth/Throat:     Comments: mask Eyes:     Conjunctiva/sclera: Conjunctivae normal.     Pupils: Pupils are equal, round, and reactive to light.  Cardiovascular:     Rate and Rhythm: Normal rate and regular rhythm.     Heart sounds: Normal heart sounds.  Pulmonary:     Effort: Pulmonary effort is normal. No respiratory distress.     Breath sounds: Normal breath sounds.    Chest:     Chest wall: Tenderness present.  Musculoskeletal:        General: Normal range of motion.     Cervical back: Normal range of motion.  Skin:    General: Skin is warm and dry.  Neurological:     Mental Status: She is alert.  Psychiatric:        Mood and Affect: Mood normal.        Behavior: Behavior normal.      UC Treatments / Results  Labs (all labs ordered are listed, but only abnormal results are displayed) Labs Reviewed  POCT URINALYSIS DIP (DEVICE) - Abnormal;  Notable for the following components:      Result Value   Hgb urine dipstick TRACE (*)    Leukocytes,Ua TRACE (*)    All other components within normal limits    EKG   Radiology DG  Chest 2 View  Result Date: 12/13/2019 CLINICAL DATA:  Back pain EXAM: CHEST - 2 VIEW COMPARISON:  05/19/2019 FINDINGS: The heart size and mediastinal contours are within normal limits. Mild, diffuse bilateral interstitial pulmonary opacity. The visualized skeletal structures are unremarkable. IMPRESSION: Mild, diffuse bilateral interstitial pulmonary opacity, which may reflect edema, infection, and/or chronic interstitial change. Appearance is generally similar to prior radiographs dated 05/19/2019. No new or focal airspace opacity. Electronically Signed   By: Eddie Candle M.D.   On: 12/13/2019 14:14   DG Cervical Spine Complete  Result Date: 12/13/2019 CLINICAL DATA:  Left-sided and posterior cervical spine pain EXAM: CERVICAL SPINE - COMPLETE 4+ VIEW COMPARISON:  CT cervical spine, 05/19/2019 FINDINGS: No fracture or static subluxation of the cervical spine. Anterior cervical discectomy and fusion of C5-C6 with bony incorporation of the disc space. Otherwise mild disc space height loss and osteophytosis. No high-grade bony neural foraminal stenosis. Partially imaged skull base, cervical soft tissues, and upper chest are unremarkable. IMPRESSION: No fracture or static subluxation of the cervical spine. Anterior cervical discectomy and fusion of C5-C6 with bony incorporation of the disc space. Otherwise mild disc space height loss and osteophytosis. No high-grade bony neural foraminal stenosis. Cervical disc and neural foraminal pathology may be further evaluated by MRI if indicated by localizing neurological signs and symptoms. Electronically Signed   By: Eddie Candle M.D.   On: 12/13/2019 14:20    Procedures Procedures (including critical care time)  Medications Ordered in UC Medications  ketorolac (TORADOL) 30  MG/ML injection 30 mg (30 mg Intramuscular Given 12/13/19 1454)    Initial Impression / Assessment and Plan / UC Course  I have reviewed the triage vital signs and the nursing notes.  Pertinent labs & imaging results that were available during my care of the patient were reviewed by me and considered in my medical decision making (see chart for details).      Final Clinical Impressions(s) / UC Diagnoses   Final diagnoses:  Cervical disc disorder with radiculopathy of cervical region     Discharge Instructions     Take the medrol dosepak as directed Take all of day one today This is a steroid, anti inflammatory medication Take the pain medicine and muscle relaxer as needed Do not drive on the pain medicine Call your doctor if not better in a few days    ED Prescriptions    Medication Sig Dispense Auth. Provider   methylPREDNISolone (MEDROL DOSEPAK) 4 MG TBPK tablet TAD 21 tablet Raylene Everts, MD   tiZANidine (ZANAFLEX) 4 MG tablet Take 1-2 tablets (4-8 mg total) by mouth every 6 (six) hours as needed for muscle spasms. 21 tablet Raylene Everts, MD   HYDROcodone-acetaminophen [facility]) 7.5-325 MG tablet Take 1 tablet by mouth every 6 (six) hours as needed for moderate pain. 15 tablet Raylene Everts, MD     I have reviewed the PDMP during this encounter.   Raylene Everts, MD 12/13/19 1535

## 2020-02-01 IMAGING — CR DG WRIST COMPLETE 3+V*L*
4 series · 4 of 4 positions shown · non-contrast
Comparison: Hand series performed today.

CLINICAL DATA: Fall.  Left wrist and hand pain.

EXAM:
LEFT WRIST - COMPLETE 3+ VIEW

[x hand lat left]
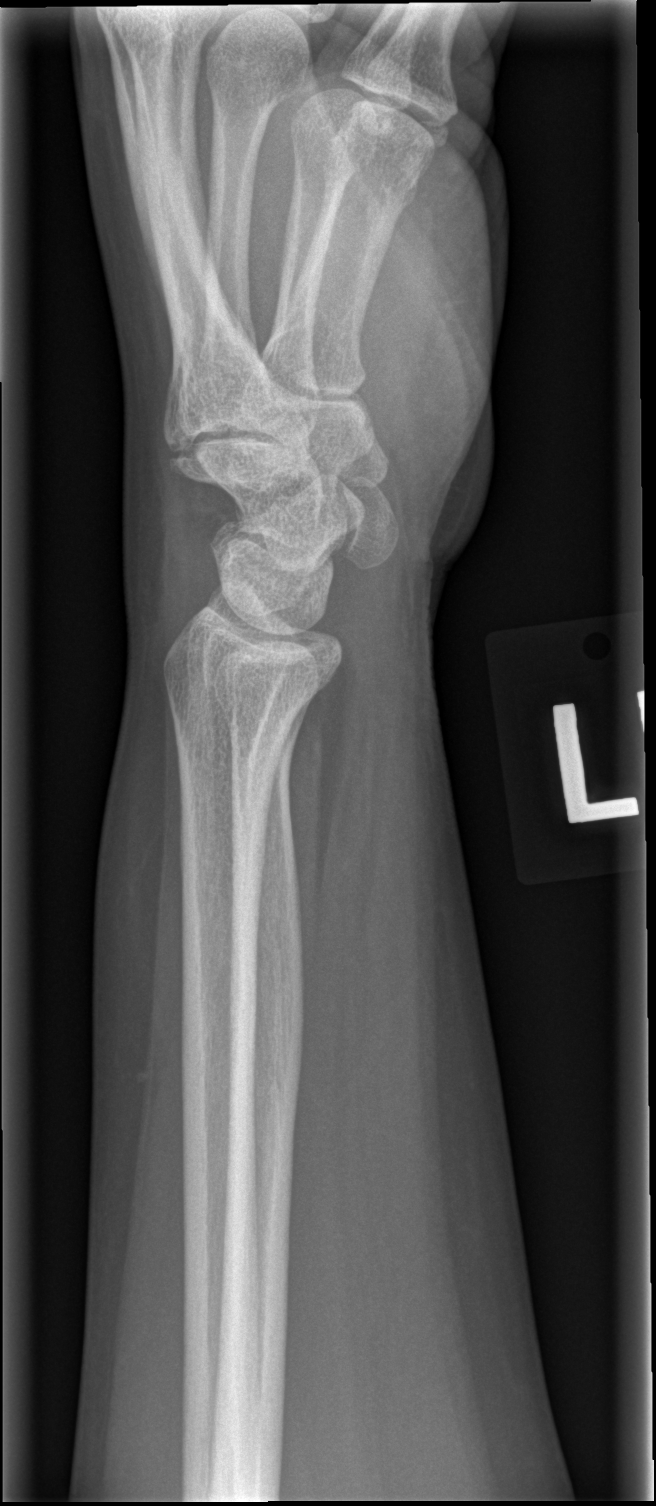

[x hand obl left]
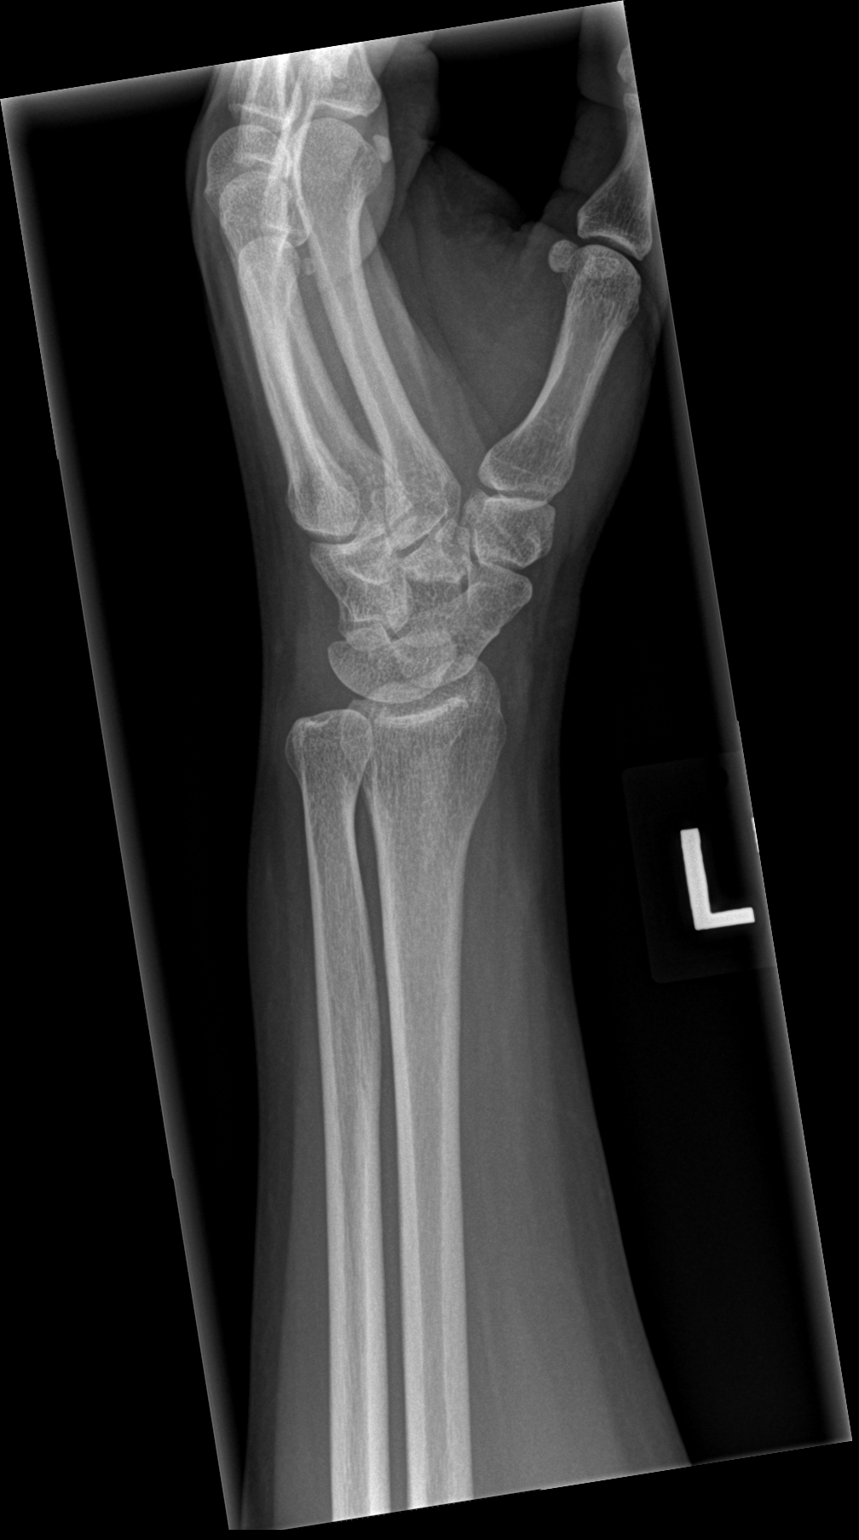

[x hand pa left (1 of 2)]
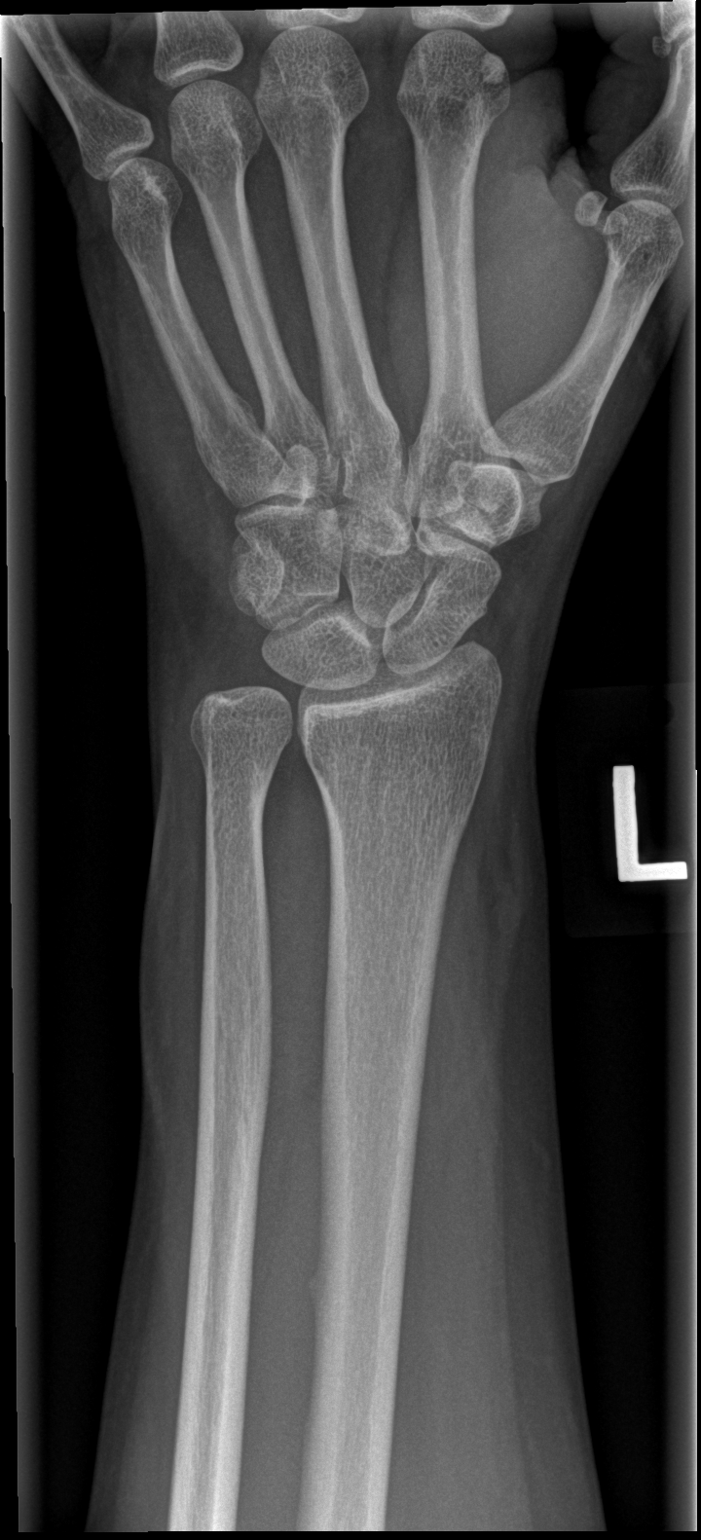

[x hand pa left (2 of 2)]
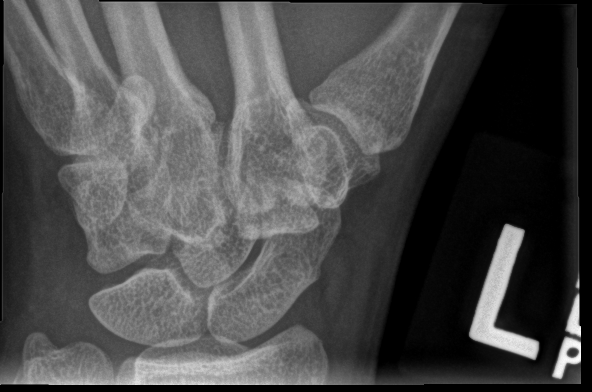

[4 of 4 positions shown; findings below may reference images not displayed]

FINDINGS: There is no evidence of fracture or dislocation. There is no
evidence of arthropathy or other focal bone abnormality. Soft
tissues are unremarkable.
IMPRESSION: Negative.

## 2020-02-01 IMAGING — CR DG HAND COMPLETE 3+V*L*
3 series · 3 of 3 positions shown · non-contrast
Comparison: None.

CLINICAL DATA: Fall.  Left hand and wrist pain.

EXAM:
LEFT HAND - COMPLETE 3+ VIEW

[x wrist pa left]
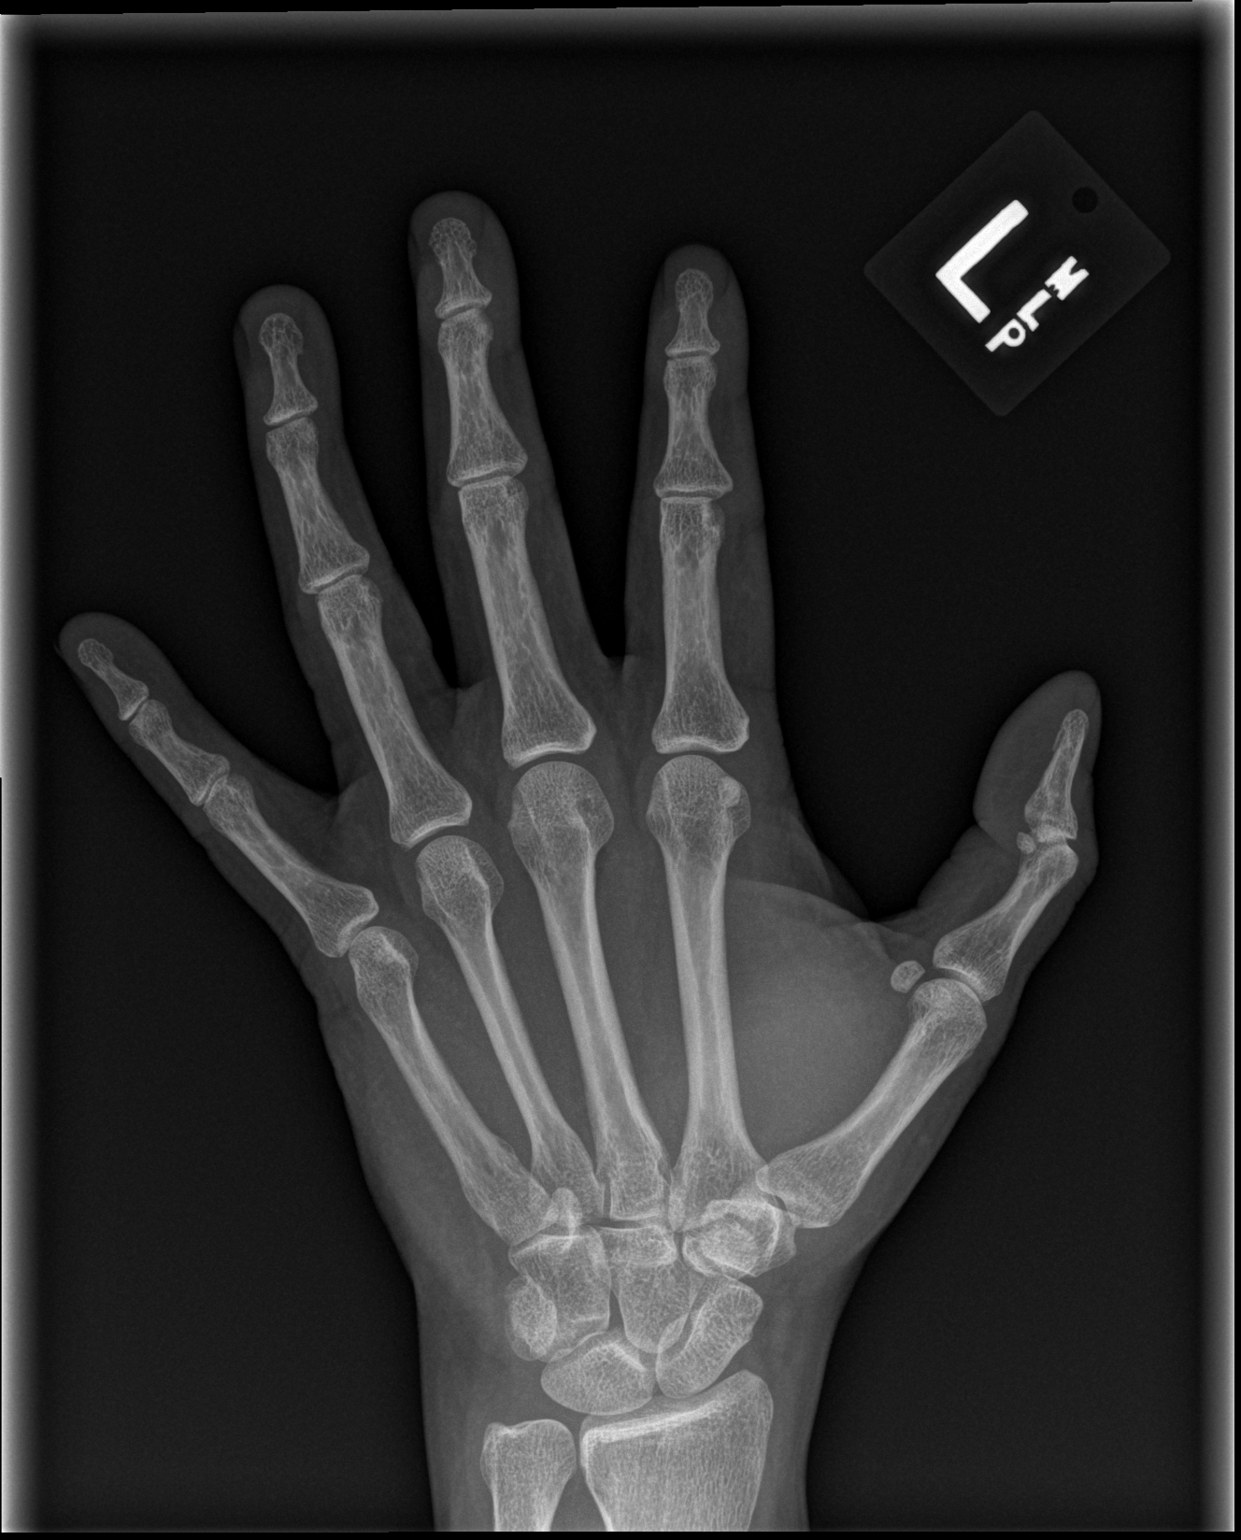

[x wrist obl left]
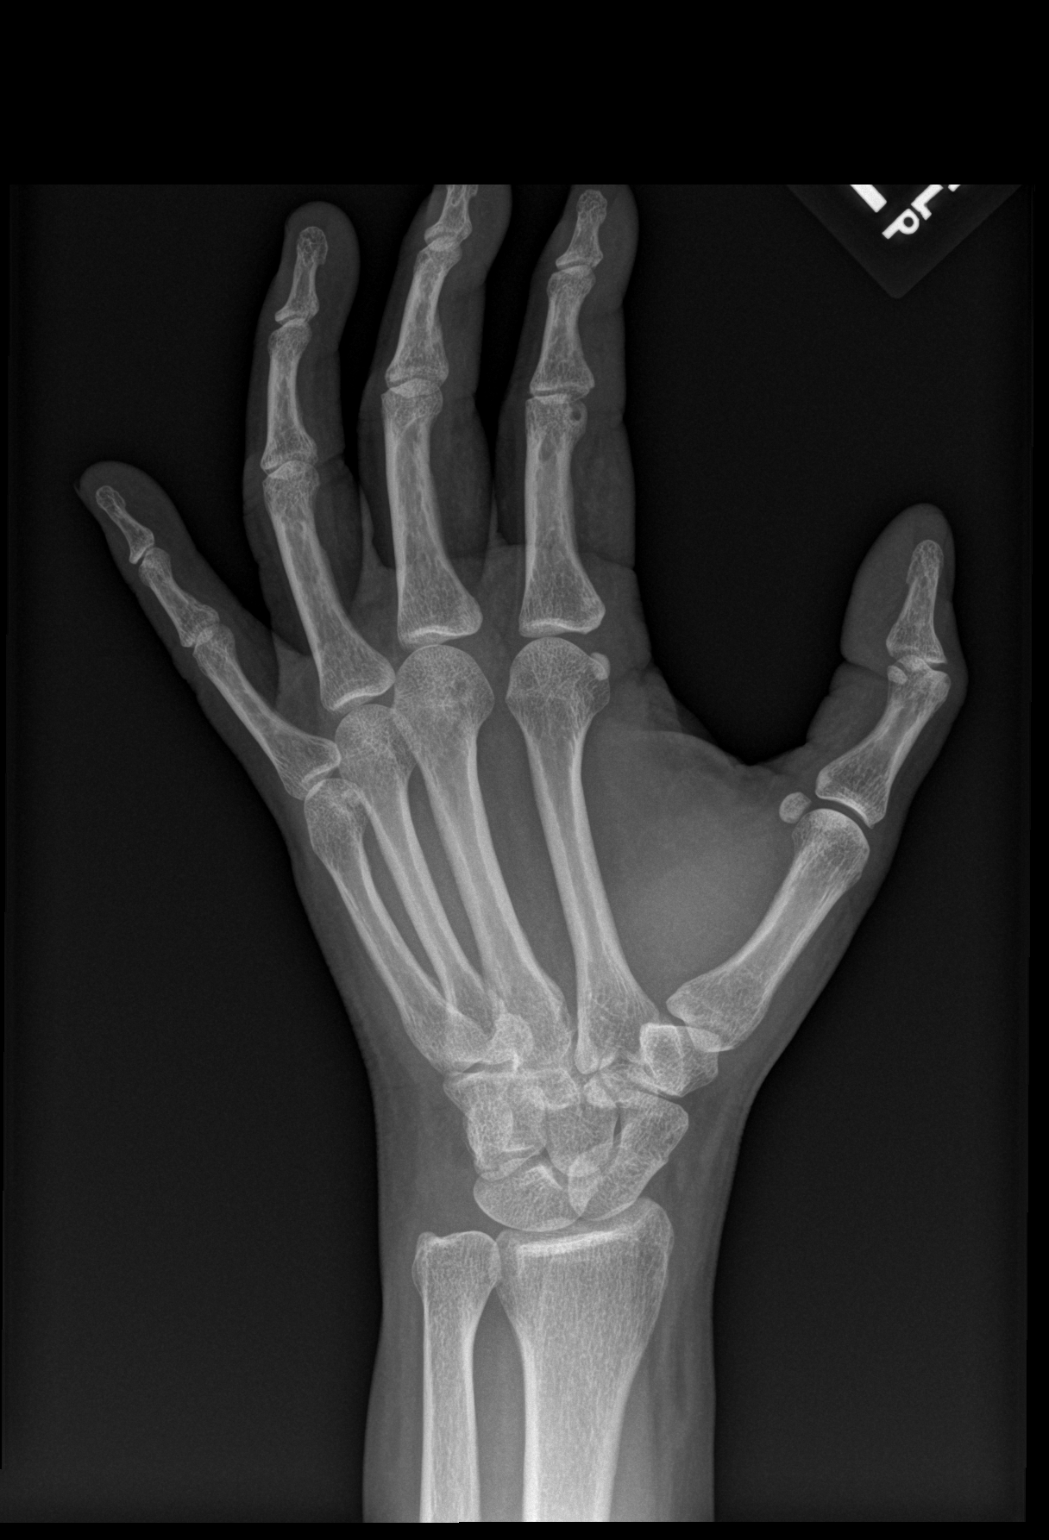

[x wrist lat left]
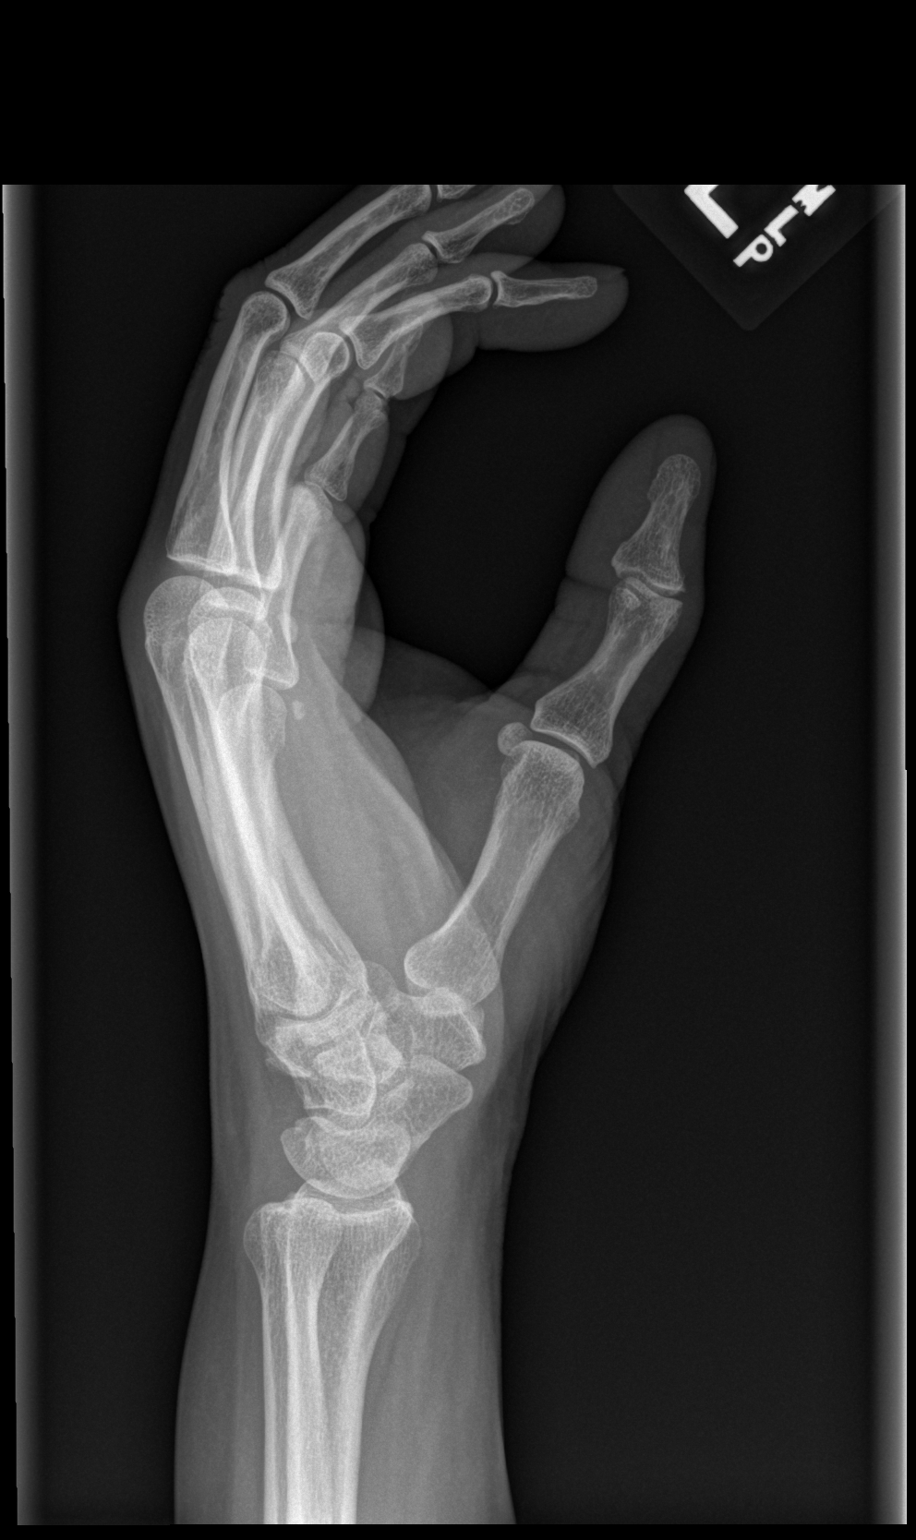

[3 of 3 positions shown; findings below may reference images not displayed]

FINDINGS: No acute bony abnormality. Specifically, no fracture, subluxation,
or dislocation. Soft tissues are intact. Joint spaces maintained.
IMPRESSION: No acute bony abnormality.

## 2020-04-01 ENCOUNTER — Encounter (HOSPITAL_COMMUNITY): Payer: Self-pay

## 2020-04-01 ENCOUNTER — Ambulatory Visit (INDEPENDENT_AMBULATORY_CARE_PROVIDER_SITE_OTHER): Payer: Managed Care, Other (non HMO)

## 2020-04-01 ENCOUNTER — Other Ambulatory Visit: Payer: Self-pay

## 2020-04-01 ENCOUNTER — Ambulatory Visit (HOSPITAL_COMMUNITY)
Admission: EM | Admit: 2020-04-01 | Discharge: 2020-04-01 | Disposition: A | Discharge status: Home / Self Care | Payer: Managed Care, Other (non HMO) | Attending: Family Medicine | Admitting: Family Medicine

## 2020-04-01 DIAGNOSIS — J4541 Moderate persistent asthma with (acute) exacerbation: Secondary | ICD-10-CM

## 2020-04-01 DIAGNOSIS — S60931A Unspecified superficial injury of right thumb, initial encounter: Secondary | ICD-10-CM

## 2020-04-01 DIAGNOSIS — M79644 Pain in right finger(s): Secondary | ICD-10-CM

## 2020-04-01 MED ORDER — ALBUTEROL SULFATE (2.5 MG/3ML) 0.083% IN NEBU: 2.5000 mg | INHALATION_SOLUTION | RESPIRATORY_TRACT | 2 refills | Status: DC | PRN

## 2020-04-01 MED ORDER — ALBUTEROL SULFATE HFA 108 (90 BASE) MCG/ACT IN AERS: 1.0000 | INHALATION_SPRAY | Freq: Four times a day (QID) | RESPIRATORY_TRACT | 2 refills | Status: DC | PRN

## 2020-04-01 MED ORDER — ALBUTEROL SULFATE HFA 108 (90 BASE) MCG/ACT IN AERS
2.0000 | INHALATION_SPRAY | Freq: Once | RESPIRATORY_TRACT | Status: AC
  Administered 2020-04-01: 2 via RESPIRATORY_TRACT

## 2020-04-01 MED ORDER — ALBUTEROL SULFATE HFA 108 (90 BASE) MCG/ACT IN AERS
INHALATION_SPRAY | RESPIRATORY_TRACT | Status: AC
  Filled 2020-04-01: qty 6.7

## 2020-04-01 NOTE — ED Triage Notes (Signed)
Pt presents with asthma flare up X 2 days; pt states she has been short of breath and wheezing & it is causing her to have a headache.  Pt states she needs a refill of inhaler and nebulizer solution.

## 2020-04-02 NOTE — ED Provider Notes (Signed)
[place]   [id number] 04/01/20 Arrival Time: [place]:  1. Moderate persistent asthma with exacerbation   2. Thumb pain, right     I have personally viewed the imaging studies ordered this visit. No thumb fracture.  Ibuprofen.   Meds ordered this encounter  Medications   albuterol (VENTOLIN HFA) 108 (90 Base) MCG/ACT inhaler 2 puff   albuterol (PROVENTIL) (2.5 MG/3ML) 0.083% nebulizer solution    Sig: Take 3 mLs (2.5 mg total) by nebulization every 4 (four) hours as needed. For shortness of breath    Dispense:  75 mL    Refill:  2   albuterol (VENTOLIN HFA) 108 (90 Base) MCG/ACT inhaler    Sig: Inhale 1-2 puffs into the lungs every 6 (six) hours as needed for wheezing or shortness of breath.    Dispense:  18 g    Refill:  2    Asthma precautions given. OTC symptom care as needed.  Recommend:  Follow-up Information    [facility].   Specialty: Urgent Care Why: If worsening or failing to improve as anticipated. Contact information: [address] [place]       Roseanne Kaufman, MD.   Specialty: Orthopedic Surgery Why: If your thumb is worsening or failing to improve as anticipated. Contact information: [address] [place] [address] [phone]               Reviewed expectations re: course of current medical issues. Questions answered. Outlined signs and symptoms indicating need for more acute intervention. Patient verbalized understanding. After Visit Summary given.  SUBJECTIVE: History from: patient.  [name] is a 54 y.o. female who presents with complaint of asthma flare/exacerbation past two days; persistent wheezing; out of albuterol meds. No cold symptoms. Afebrile. No current SOB or CP. Also reports R thumb pain; hyperextended a few days ago. No extremity sensation changes or weakness. Stiff.    Social  History   Tobacco Use  Smoking Status Never Smoker  Smokeless Tobacco Never Used     OBJECTIVE:  Vitals:   04/01/20 1900  BP: 135/85  Pulse: 76  Resp: 16  Temp: 98 F (36.7 C)  TempSrc: Oral  SpO2: 99%     General appearance: alert; NAD HEENT: [place]; AT; without nasal congestion Neck: supple without LAD Lungs: unlabored respirations, moderate bilateral wheezing; cough: absent; no significant respiratory distress R thumb with proximal TTP; ROM decreased secondary to reported discomfort Skin: warm and dry Psychological: alert and cooperative; normal mood and affect  Imaging: DG Finger Thumb Right  Result Date: 04/01/2020 CLINICAL DATA:  Right thumb hyperextension, pain. EXAM: RIGHT THUMB 2+V COMPARISON:  None. FINDINGS: There is no evidence of fracture or dislocation. There is no evidence of arthropathy or other focal bone abnormality. Soft tissues are unremarkable. IMPRESSION: Negative radiographs of the right thumb. Electronically Signed   By: Keith Rake M.D.   On: 04/01/2020 19:38    No Known Allergies  Past Medical History:  Diagnosis Date   Asthma    GERD (gastroesophageal reflux disease)    History reviewed. No pertinent family history. Social History   Socioeconomic History   Marital status: Single    Spouse name: Not on file   Number of children: Not on file   Years of education: Not on file   Highest education level: Not on file  Occupational History   Not on file  Tobacco Use  Smoking status: Never Smoker   Smokeless tobacco: Never Used  Substance and Sexual Activity   Alcohol use: No   Drug use: No   Sexual activity: Never  Other Topics Concern   Not on file  Social History Narrative   Not on file   Social Determinants of Health   Financial Resource Strain:    Difficulty of Paying Living Expenses:   Food Insecurity:    Worried About Programme researcher, broadcasting/film/video in the Last Year:    Barista in the Last Year:     Transportation Needs:    Freight forwarder (Medical):    Lack of Transportation (Non-Medical):   Physical Activity:    Days of Exercise per Week:    Minutes of Exercise per Session:   Stress:    Feeling of Stress :   Social Connections:    Frequency of Communication with Friends and Family:    Frequency of Social Gatherings with Friends and Family:    Attends Religious Services:    Active Member of Clubs or Organizations:    Attends Banker Meetings:    Marital Status:   Intimate Partner Violence:    Fear of Current or Ex-Partner:    Emotionally Abused:    Physically Abused:    Sexually Abused:             Mardella Layman, MD 04/02/20 [phone]

## 2020-04-21 ENCOUNTER — Ambulatory Visit (HOSPITAL_COMMUNITY)
Admission: EM | Admit: 2020-04-21 | Discharge: 2020-04-21 | Disposition: A | Discharge status: Home / Self Care | Payer: Managed Care, Other (non HMO) | Attending: Urgent Care | Admitting: Urgent Care

## 2020-04-21 ENCOUNTER — Encounter (HOSPITAL_COMMUNITY): Payer: Self-pay

## 2020-04-21 ENCOUNTER — Other Ambulatory Visit: Payer: Self-pay

## 2020-04-21 DIAGNOSIS — J4520 Mild intermittent asthma, uncomplicated: Secondary | ICD-10-CM

## 2020-04-21 DIAGNOSIS — R059 Cough, unspecified: Secondary | ICD-10-CM

## 2020-04-21 DIAGNOSIS — J40 Bronchitis, not specified as acute or chronic: Secondary | ICD-10-CM | POA: Diagnosis not present

## 2020-04-21 MED ORDER — METHYLPREDNISOLONE ACETATE 80 MG/ML IJ SUSP
INTRAMUSCULAR | Status: AC
  Filled 2020-04-21: qty 1

## 2020-04-21 MED ORDER — BENZONATATE 100 MG PO CAPS: 100.0000 mg | ORAL_CAPSULE | Freq: Three times a day (TID) | ORAL | 0 refills | Status: DC | PRN

## 2020-04-21 MED ORDER — AZITHROMYCIN 250 MG PO TABS: ORAL_TABLET | ORAL | 0 refills | Status: DC

## 2020-04-21 MED ORDER — PROMETHAZINE-DM 6.25-15 MG/5ML PO SYRP: 5.0000 mL | ORAL_SOLUTION | Freq: Every evening | ORAL | 0 refills | Status: DC | PRN

## 2020-04-21 MED ORDER — METHYLPREDNISOLONE ACETATE 80 MG/ML IJ SUSP
80.0000 mg | Freq: Once | INTRAMUSCULAR | Status: AC
  Administered 2020-04-21: 80 mg via INTRAMUSCULAR

## 2020-04-21 NOTE — ED Triage Notes (Signed)
Pt reports having an asthma flare up for a week. pts reports having SOB and cough. Reports she has been using an inhaler with no relief.

## 2020-04-21 NOTE — ED Provider Notes (Signed)
[facility]   MRN: [id number] DOB: July 29, 1966  Subjective:   [name] is a 54 y.o. female presenting for 1 week history of persistent dry hacking cough.  Has felt wheezing, shortness of breath.  Has been using albuterol inhaler with minimal relief.  Denies fever, chest pain, loss of sense of taste or smell.  No current facility-administered medications for this encounter.  Current Outpatient Medications:  .  abacavir-dolutegravir-lamiVUDine (TRIUMEQ) 600-50-300 MG tablet, Take by mouth., Disp: , Rfl:  .  acetic acid-hydrocortisone (VOSOL-HC) OTIC solution, Place 4 drops into the right ear 2 (two) times daily., Disp: 10 mL, Rfl: 0 .  albuterol (PROVENTIL) (2.5 MG/3ML) 0.083% nebulizer solution, Take 3 mLs (2.5 mg total) by nebulization every 4 (four) hours as needed. For shortness of breath, Disp: 75 mL, Rfl: 2 .  albuterol (VENTOLIN HFA) 108 (90 Base) MCG/ACT inhaler, Inhale 1-2 puffs into the lungs every 6 (six) hours as needed for wheezing or shortness of breath., Disp: 18 g, Rfl: 2 .  fluticasone (FLOVENT HFA) 110 MCG/ACT inhaler, Inhale 2 puffs into the lungs 2 (two) times daily., Disp: 1 Inhaler, Rfl: 0 .  Fluticasone-Salmeterol (ADVAIR) 250-50 MCG/DOSE AEPB, Inhale 1 puff into the lungs every 12 (twelve) hours., Disp: 60 each, Rfl: 0 .  HYDROcodone-acetaminophen (NORCO) 7.5-325 MG tablet, Take 1 tablet by mouth every 6 (six) hours as needed for moderate pain., Disp: 15 tablet, Rfl: 0 .  meclizine (ANTIVERT) 25 MG tablet, Take 1 tablet (25 mg total) by mouth 3 (three) times daily as needed for dizziness., Disp: 30 tablet, Rfl: 0 .  methylPREDNISolone (MEDROL DOSEPAK) 4 MG TBPK tablet, TAD, Disp: 21 tablet, Rfl: 0 .  naproxen (NAPROSYN) 500 MG tablet, Take 1 tablet (500 mg total) by mouth 2 (two) times daily., Disp: 30 tablet, Rfl: 0 .  predniSONE (STERAPRED UNI-PAK 21 TAB) 10 MG (21) TBPK tablet, Dispense one 6 day pack. Take as directed with food., Disp: 21  tablet, Rfl: 0 .  Spacer/Aero-Holding Chambers (AEROCHAMBER PLUS) inhaler, Use as instructed, Disp: 1 each, Rfl: 2 .  tiZANidine (ZANAFLEX) 4 MG tablet, Take 1-2 tablets (4-8 mg total) by mouth every 6 (six) hours as needed for muscle spasms., Disp: 21 tablet, Rfl: 0 .  triamcinolone cream (KENALOG) 0.1 %, Apply 1 application topically 2 (two) times daily., Disp: 30 g, Rfl: 0   No Known Allergies  Past Medical History:  Diagnosis Date  . Asthma   . GERD (gastroesophageal reflux disease)      Past Surgical History:  Procedure Laterality Date  . ABDOMINAL HYSTERECTOMY      History reviewed. No pertinent family history.  Social History   Tobacco Use  . Smoking status: Never Smoker  . Smokeless tobacco: Never Used  Substance Use Topics  . Alcohol use: No  . Drug use: No    ROS   Objective:   Vitals: BP (!) 140/119   Pulse 85   Temp 98.5 F (36.9 C)   Resp 20   Ht 5\' 8"  (1.727 m)   Wt 184 lb 1.4 oz (83.5 kg)   SpO2 100%   BMI 27.99 kg/m   Physical Exam Constitutional:      General: She is not in acute distress.    Appearance: Normal appearance. She is well-developed. She is not ill-appearing, toxic-appearing or diaphoretic.  HENT:     Head: Normocephalic and atraumatic.     Right Ear: Tympanic membrane and ear canal normal. No drainage or tenderness. No middle  ear effusion. Tympanic membrane is not erythematous.     Left Ear: Tympanic membrane and ear canal normal. No drainage or tenderness.  No middle ear effusion. Tympanic membrane is not erythematous.     Nose: Nose normal. No congestion or rhinorrhea.     Mouth/Throat:     Mouth: Mucous membranes are moist. No oral lesions.     Pharynx: No pharyngeal swelling, oropharyngeal exudate, posterior oropharyngeal erythema or uvula swelling.     Tonsils: No tonsillar exudate or tonsillar abscesses.  Eyes:     Extraocular Movements: Extraocular movements intact.     Right eye: Normal extraocular motion.     Left  eye: Normal extraocular motion.     Conjunctiva/sclera: Conjunctivae normal.     Pupils: Pupils are equal, round, and reactive to light.  Cardiovascular:     Rate and Rhythm: Normal rate and regular rhythm.     Pulses: Normal pulses.     Heart sounds: Normal heart sounds. No murmur heard.  No friction rub. No gallop.   Pulmonary:     Effort: Pulmonary effort is normal. No respiratory distress.     Breath sounds: No stridor. Rhonchi present. No wheezing or rales.  Musculoskeletal:     Cervical back: Normal range of motion and neck supple.  Lymphadenopathy:     Cervical: No cervical adenopathy.  Skin:    General: Skin is warm and dry.     Findings: No rash.  Neurological:     General: No focal deficit present.     Mental Status: She is alert and oriented to person, place, and time.  Psychiatric:        Mood and Affect: Mood normal.        Behavior: Behavior normal.        Thought Content: Thought content normal.     Assessment and Plan :   PDMP not reviewed this encounter.  1. Bronchitis   2. Mild intermittent asthma without complication   3. Cough     Will cover for acute bronchitis with azithromycin.  Cover for her asthma and persistent allergies with Depo-Medrol.  Recommend supportive care otherwise.  Patient has been Covid vaccinated, low suspicion for this.  Patient declined testing at this time.  Emphasized need for follow-up if symptoms persist including consideration for COVID-19 testing, chest x-ray. Counseled patient on potential for adverse effects with medications prescribed/recommended today, ER and return-to-clinic precautions discussed, patient verbalized understanding.    Wallis Bamberg, [place] 04/27/20 [phone]

## 2020-05-05 ENCOUNTER — Ambulatory Visit (HOSPITAL_COMMUNITY)
Admission: EM | Admit: 2020-05-05 | Discharge: 2020-05-05 | Disposition: A | Discharge status: Home / Self Care | Payer: Managed Care, Other (non HMO) | Attending: Urgent Care | Admitting: Urgent Care

## 2020-05-05 ENCOUNTER — Other Ambulatory Visit: Payer: Self-pay

## 2020-05-05 ENCOUNTER — Encounter (HOSPITAL_COMMUNITY): Payer: Self-pay

## 2020-05-05 DIAGNOSIS — R112 Nausea with vomiting, unspecified: Secondary | ICD-10-CM

## 2020-05-05 DIAGNOSIS — J3089 Other allergic rhinitis: Secondary | ICD-10-CM | POA: Diagnosis not present

## 2020-05-05 DIAGNOSIS — R42 Dizziness and giddiness: Secondary | ICD-10-CM

## 2020-05-05 MED ORDER — CETIRIZINE HCL 10 MG PO TABS: 10.0000 mg | ORAL_TABLET | Freq: Every day | ORAL | 0 refills | Status: DC

## 2020-05-05 MED ORDER — ONDANSETRON 8 MG PO TBDP: 8.0000 mg | ORAL_TABLET | Freq: Three times a day (TID) | ORAL | 0 refills | Status: DC | PRN

## 2020-05-05 MED ORDER — MONTELUKAST SODIUM 10 MG PO TABS: 10.0000 mg | ORAL_TABLET | Freq: Every day | ORAL | 3 refills | Status: DC

## 2020-05-05 MED ORDER — FLUTICASONE PROPIONATE 50 MCG/ACT NA SUSP: 2.0000 | Freq: Every day | NASAL | 0 refills | Status: DC

## 2020-05-05 NOTE — ED Provider Notes (Signed)
[facility]   MRN: [id number] DOB: 04/18/1966  Subjective:   [name] is a 54 y.o. female presenting for 1 week history of nausea, congestion, headache and difficulty breathing when she walks into her room. Patient states that she has found mildew and black mold and has tried to clean it but on further inspection of her room finds that it is more involved and has cracks in the wall that she suspects have more mildew and mold. She has contacted her Investment banker, corporate and there supposedly supposed to come over to fix it. States that the symptoms generally occur when she walks into her room and do not happen outside of that. She does have a history of asthma, allergies and is not on any allergy medications consistently. At her last office visit she was treated for acute bronchitis and reports that her symptoms have completely resolved.  No current facility-administered medications for this encounter.  Current Outpatient Medications:  .  abacavir-dolutegravir-lamiVUDine (TRIUMEQ) 600-50-300 MG tablet, Take by mouth., Disp: , Rfl:  .  acetic acid-hydrocortisone (VOSOL-HC) OTIC solution, Place 4 drops into the right ear 2 (two) times daily., Disp: 10 mL, Rfl: 0 .  albuterol (PROVENTIL) (2.5 MG/3ML) 0.083% nebulizer solution, Take 3 mLs (2.5 mg total) by nebulization every 4 (four) hours as needed. For shortness of breath, Disp: 75 mL, Rfl: 2 .  albuterol (VENTOLIN HFA) 108 (90 Base) MCG/ACT inhaler, Inhale 1-2 puffs into the lungs every 6 (six) hours as needed for wheezing or shortness of breath., Disp: 18 g, Rfl: 2 .  azithromycin (ZITHROMAX) 250 MG tablet, Start with 2 tablets today, then 1 daily thereafter., Disp: 6 tablet, Rfl: 0 .  benzonatate (TESSALON) 100 MG capsule, Take 1-2 capsules (100-200 mg total) by mouth 3 (three) times daily as needed., Disp: 60 capsule, Rfl: 0 .  fluticasone (FLOVENT HFA) 110 MCG/ACT inhaler, Inhale 2 puffs into the lungs 2 (two) times daily.,  Disp: 1 Inhaler, Rfl: 0 .  Fluticasone-Salmeterol (ADVAIR) 250-50 MCG/DOSE AEPB, Inhale 1 puff into the lungs every 12 (twelve) hours., Disp: 60 each, Rfl: 0 .  HYDROcodone-acetaminophen (NORCO) 7.5-325 MG tablet, Take 1 tablet by mouth every 6 (six) hours as needed for moderate pain., Disp: 15 tablet, Rfl: 0 .  meclizine (ANTIVERT) 25 MG tablet, Take 1 tablet (25 mg total) by mouth 3 (three) times daily as needed for dizziness., Disp: 30 tablet, Rfl: 0 .  methylPREDNISolone (MEDROL DOSEPAK) 4 MG TBPK tablet, TAD, Disp: 21 tablet, Rfl: 0 .  naproxen (NAPROSYN) 500 MG tablet, Take 1 tablet (500 mg total) by mouth 2 (two) times daily., Disp: 30 tablet, Rfl: 0 .  predniSONE (STERAPRED UNI-PAK 21 TAB) 10 MG (21) TBPK tablet, Dispense one 6 day pack. Take as directed with food., Disp: 21 tablet, Rfl: 0 .  promethazine-dextromethorphan (PROMETHAZINE-DM) 6.25-15 MG/5ML syrup, Take 5 mLs by mouth at bedtime as needed for cough., Disp: 100 mL, Rfl: 0 .  Spacer/Aero-Holding Chambers (AEROCHAMBER PLUS) inhaler, Use as instructed, Disp: 1 each, Rfl: 2 .  tiZANidine (ZANAFLEX) 4 MG tablet, Take 1-2 tablets (4-8 mg total) by mouth every 6 (six) hours as needed for muscle spasms., Disp: 21 tablet, Rfl: 0 .  triamcinolone cream (KENALOG) 0.1 %, Apply 1 application topically 2 (two) times daily., Disp: 30 g, Rfl: 0   No Known Allergies  Past Medical History:  Diagnosis Date  . Asthma   . GERD (gastroesophageal reflux disease)      Past Surgical History:  Procedure Laterality  Date  . ABDOMINAL HYSTERECTOMY      History reviewed. No pertinent family history.  Social History   Tobacco Use  . Smoking status: Never Smoker  . Smokeless tobacco: Never Used  Substance Use Topics  . Alcohol use: No  . Drug use: No    ROS   Objective:   Vitals: BP (!) 140/105   Pulse 77   Temp 98.1 F (36.7 C)   Resp 16   SpO2 98%   Physical Exam Constitutional:      General: She is not in acute distress.     Appearance: Normal appearance. She is well-developed. She is not ill-appearing, toxic-appearing or diaphoretic.  HENT:     Head: Normocephalic and atraumatic.     Right Ear: Tympanic membrane, ear canal and external ear normal. No drainage or tenderness. No middle ear effusion. Tympanic membrane is not erythematous.     Left Ear: Tympanic membrane, ear canal and external ear normal. No drainage or tenderness.  No middle ear effusion. Tympanic membrane is not erythematous.     Nose: Congestion present. No rhinorrhea.     Mouth/Throat:     Mouth: Mucous membranes are moist. No oral lesions.     Pharynx: Oropharynx is clear. No pharyngeal swelling, oropharyngeal exudate, posterior oropharyngeal erythema or uvula swelling.     Tonsils: No tonsillar exudate or tonsillar abscesses.  Eyes:     General: No scleral icterus.       Right eye: No discharge.        Left eye: No discharge.     Extraocular Movements: Extraocular movements intact.     Right eye: Normal extraocular motion.     Left eye: Normal extraocular motion.     Conjunctiva/sclera: Conjunctivae normal.     Pupils: Pupils are equal, round, and reactive to light.  Cardiovascular:     Rate and Rhythm: Normal rate and regular rhythm.     Pulses: Normal pulses.     Heart sounds: Normal heart sounds. No murmur heard.  No friction rub. No gallop.   Pulmonary:     Effort: Pulmonary effort is normal. No respiratory distress.     Breath sounds: Normal breath sounds. No stridor. No wheezing, rhonchi or rales.  Musculoskeletal:     Cervical back: Normal range of motion and neck supple.  Lymphadenopathy:     Cervical: No cervical adenopathy.  Skin:    General: Skin is warm and dry.     Findings: No rash.  Neurological:     General: No focal deficit present.     Mental Status: She is alert and oriented to person, place, and time.     Cranial Nerves: No cranial nerve deficit.     Motor: No weakness.     Coordination: Romberg sign  negative. Coordination normal.     Gait: Gait normal.     Deep Tendon Reflexes: Reflexes normal.  Psychiatric:        Mood and Affect: Mood normal.        Speech: Speech normal.        Behavior: Behavior normal.        Thought Content: Thought content normal.        Judgment: Judgment normal.       Assessment and Plan :   PDMP not reviewed this encounter.  1. Allergic rhinitis due to other allergic trigger, unspecified seasonality   2. Nausea and vomiting, intractability of vomiting not specified, unspecified vomiting type   3. Dizziness  Suspect an allergic rhinitis, sinus headache related to the allergens in her bedroom. Emphasized need to start a regular allergy regimen including Zyrtec, Flonase, Singulair. Encourage patient to continue following through with her property manager fixing the issue in her bedroom. Counseled patient on potential for adverse effects with medications prescribed/recommended today, ER and return-to-clinic precautions discussed, patient verbalized understanding.    Wallis Bamberg, [place] 05/10/20 [phone]

## 2020-05-05 NOTE — ED Triage Notes (Signed)
Pt c/o nausea and headache when walking into room at home, concerned she has mold or mildew in her home.

## 2020-05-06 ENCOUNTER — Telehealth (HOSPITAL_COMMUNITY): Payer: Self-pay

## 2020-05-06 MED ORDER — CETIRIZINE HCL 10 MG PO TABS: 10.0000 mg | ORAL_TABLET | Freq: Every day | ORAL | 0 refills | Status: DC

## 2020-05-06 MED ORDER — FLUTICASONE PROPIONATE 50 MCG/ACT NA SUSP: 2.0000 | Freq: Every day | NASAL | 0 refills | Status: DC

## 2020-05-06 MED ORDER — ONDANSETRON 8 MG PO TBDP: 8.0000 mg | ORAL_TABLET | Freq: Three times a day (TID) | ORAL | 0 refills | Status: DC | PRN

## 2020-05-06 MED ORDER — MONTELUKAST SODIUM 10 MG PO TABS: 10.0000 mg | ORAL_TABLET | Freq: Every day | ORAL | 3 refills | Status: DC

## 2020-05-06 NOTE — Telephone Encounter (Signed)
Patient called requesting medication be sent to a different pharmacy.

## 2020-05-10 ENCOUNTER — Encounter (HOSPITAL_COMMUNITY): Payer: Self-pay | Admitting: Urgent Care

## 2020-09-12 ENCOUNTER — Encounter (HOSPITAL_COMMUNITY): Payer: Self-pay | Admitting: *Deleted

## 2020-09-12 ENCOUNTER — Other Ambulatory Visit: Payer: Self-pay

## 2020-09-12 ENCOUNTER — Ambulatory Visit (HOSPITAL_COMMUNITY)
Admission: EM | Admit: 2020-09-12 | Discharge: 2020-09-12 | Disposition: A | Discharge status: Home / Self Care | Payer: HRSA Program | Attending: Physician Assistant | Admitting: Physician Assistant

## 2020-09-12 ENCOUNTER — Ambulatory Visit (INDEPENDENT_AMBULATORY_CARE_PROVIDER_SITE_OTHER): Payer: HRSA Program

## 2020-09-12 DIAGNOSIS — J4541 Moderate persistent asthma with (acute) exacerbation: Secondary | ICD-10-CM | POA: Diagnosis not present

## 2020-09-12 DIAGNOSIS — R0989 Other specified symptoms and signs involving the circulatory and respiratory systems: Secondary | ICD-10-CM

## 2020-09-12 DIAGNOSIS — Z1152 Encounter for screening for COVID-19: Secondary | ICD-10-CM | POA: Diagnosis present

## 2020-09-12 DIAGNOSIS — R062 Wheezing: Secondary | ICD-10-CM

## 2020-09-12 MED ORDER — AZITHROMYCIN 250 MG PO TABS: 250.0000 mg | ORAL_TABLET | Freq: Every day | ORAL | 0 refills | Status: DC

## 2020-09-12 MED ORDER — PREDNISONE 10 MG (21) PO TBPK: ORAL_TABLET | ORAL | 0 refills | Status: DC

## 2020-09-12 NOTE — ED Provider Notes (Signed)
[facility]    CSN: [id number] Arrival date & time: 09/12/20  1638      History   Chief Complaint Chief Complaint  Patient presents with  . Cough  . Headache  . Sore Throat    HPI [name] is a 54 y.o. female.   Pt with h/o asthma complains of cough, sore throat, and headache that started 2 days ago. She reports wheezing today with temporary improvement with nebulizer treatment at home.  She has used her neb 4 times today.  She reports subjective fever at home.  Coworker tested positive for COVID recently.        Past Medical History:  Diagnosis Date  . Asthma   . GERD (gastroesophageal reflux disease)     Patient Active Problem List   Diagnosis Date Noted  . HIV positive (HCC) 12/13/2019  . Asthma, chronic 12/13/2019  . Environmental allergies 12/13/2019  . Spondylolisthesis at L5-S1 level 12/13/2019    Past Surgical History:  Procedure Laterality Date  . ABDOMINAL HYSTERECTOMY      OB History   No obstetric history on file.      Home Medications    Prior to Admission medications   Medication Sig Start Date End Date Taking? Authorizing Provider  abacavir-dolutegravir-lamiVUDine (TRIUMEQ) 600-50-300 MG tablet Take by mouth. 11/29/16   [provider]  acetic acid-hydrocortisone (VOSOL-HC) OTIC solution Place 4 drops into the right ear 2 (two) times daily. 08/11/19   Domenick Gong, MD  albuterol (PROVENTIL) (2.5 MG/3ML) 0.083% nebulizer solution Take 3 mLs (2.5 mg total) by nebulization every 4 (four) hours as needed. For shortness of breath 04/01/20   Mardella Layman, MD  albuterol (VENTOLIN HFA) 108 (90 Base) MCG/ACT inhaler Inhale 1-2 puffs into the lungs every 6 (six) hours as needed for wheezing or shortness of breath. 04/01/20   Mardella Layman, MD  azithromycin (ZITHROMAX) 250 MG tablet Start with 2 tablets today, then 1 daily thereafter. 04/21/20   Wallis Bamberg, PA-C  benzonatate (TESSALON) 100 MG capsule Take 1-2  capsules (100-200 mg total) by mouth 3 (three) times daily as needed. 04/21/20   Wallis Bamberg, PA-C  cetirizine (ZYRTEC ALLERGY) 10 MG tablet Take 1 tablet (10 mg total) by mouth daily. 05/06/20   Wallis Bamberg, PA-C  fluticasone (FLONASE) 50 MCG/ACT nasal spray Place 2 sprays into both nostrils daily. 05/06/20   Wallis Bamberg, PA-C  fluticasone (FLOVENT HFA) 110 MCG/ACT inhaler Inhale 2 puffs into the lungs 2 (two) times daily. 08/11/19   Domenick Gong, MD  Fluticasone-Salmeterol (ADVAIR) 250-50 MCG/DOSE AEPB Inhale 1 puff into the lungs every 12 (twelve) hours. 08/11/19   Domenick Gong, MD  HYDROcodone-acetaminophen (NORCO) 7.5-325 MG tablet Take 1 tablet by mouth every 6 (six) hours as needed for moderate pain. 12/13/19   Eustace Moore, MD  meclizine (ANTIVERT) 25 MG tablet Take 1 tablet (25 mg total) by mouth 3 (three) times daily as needed for dizziness. 08/11/19   Domenick Gong, MD  methylPREDNISolone (MEDROL DOSEPAK) 4 MG TBPK tablet TAD 12/13/19   Eustace Moore, MD  montelukast (SINGULAIR) 10 MG tablet Take 1 tablet (10 mg total) by mouth at bedtime. 05/06/20   Wallis Bamberg, PA-C  naproxen (NAPROSYN) 500 MG tablet Take 1 tablet (500 mg total) by mouth 2 (two) times daily. 12/07/18   Ronnie Doss A, PA-C  ondansetron (ZOFRAN-ODT) 8 MG disintegrating tablet Take 1 tablet (8 mg total) by mouth every 8 (eight) hours as needed for nausea or vomiting. 05/06/20  Wallis Bamberg, PA-C  predniSONE (STERAPRED UNI-PAK 21 TAB) 10 MG (21) TBPK tablet Dispense one 6 day pack. Take as directed with food. 08/11/19   Domenick Gong, MD  promethazine-dextromethorphan (PROMETHAZINE-DM) 6.25-15 MG/5ML syrup Take 5 mLs by mouth at bedtime as needed for cough. 04/21/20   Wallis Bamberg, PA-C  Spacer/Aero-Holding Chambers (AEROCHAMBER PLUS) inhaler Use as instructed 08/11/19   Domenick Gong, MD  tiZANidine (ZANAFLEX) 4 MG tablet Take 1-2 tablets (4-8 mg total) by mouth every 6 (six) hours as needed for muscle  spasms. 12/13/19   Eustace Moore, MD  triamcinolone cream (KENALOG) 0.1 % Apply 1 application topically 2 (two) times daily. 07/26/18   Mardella Layman, MD    Family History No family history on file.  Social History Social History   Tobacco Use  . Smoking status: Never Smoker  . Smokeless tobacco: Never Used  Substance Use Topics  . Alcohol use: No  . Drug use: No     Allergies   Patient has no known allergies.   Review of Systems Review of Systems  Constitutional: Positive for fever. Negative for chills.  HENT: Positive for sore throat. Negative for ear pain.   Eyes: Negative for pain and visual disturbance.  Respiratory: Positive for cough and wheezing. Negative for shortness of breath.   Cardiovascular: Negative for chest pain and palpitations.  Gastrointestinal: Negative for abdominal pain, diarrhea, nausea and vomiting.  Genitourinary: Negative for dysuria and hematuria.  Musculoskeletal: Negative for arthralgias and back pain.  Skin: Negative for color change and rash.  Neurological: Negative for seizures and syncope.  All other systems reviewed and are negative.    Physical Exam Triage Vital Signs ED Triage Vitals [09/12/20 1817]  Enc Vitals Group     BP (!) 117/50     Pulse Rate 94     Resp (!) 22     Temp (!) 97.4 F (36.3 C)     Temp Source Oral     SpO2      Weight      Height      Head Circumference      Peak Flow      Pain Score      Pain Loc      Pain Edu?      Excl. in GC?    No data found.  Updated Vital Signs BP (!) 117/50 (BP Location: Right Arm)   Pulse 94   Temp (!) 97.4 F (36.3 C) (Oral)   Resp (!) 22   Visual Acuity Right Eye Distance:   Left Eye Distance:   Bilateral Distance:    Right Eye Near:   Left Eye Near:    Bilateral Near:     Physical Exam Vitals and nursing note reviewed.  Constitutional:      General: She is not in acute distress.    Appearance: She is well-developed.  HENT:     Head:  Normocephalic and atraumatic.  Eyes:     Conjunctiva/sclera: Conjunctivae normal.  Cardiovascular:     Rate and Rhythm: Normal rate and regular rhythm.     Heart sounds: No murmur heard.   Pulmonary:     Effort: Pulmonary effort is normal. No tachypnea, accessory muscle usage, prolonged expiration or respiratory distress.     Breath sounds: Examination of the right-middle field reveals rales. Examination of the left-middle field reveals rales. Examination of the right-lower field reveals decreased breath sounds. Examination of the left-lower field reveals decreased breath sounds. Decreased breath sounds  and rales present. No wheezing.  Abdominal:     Palpations: Abdomen is soft.     Tenderness: There is no abdominal tenderness.  Musculoskeletal:     Cervical back: Neck supple.  Skin:    General: Skin is warm and dry.  Neurological:     Mental Status: She is alert.      UC Treatments / Results  Labs (all labs ordered are listed, but only abnormal results are displayed) Labs Reviewed - No data to display  EKG   Radiology No results found.  Procedures Procedures (including critical care time)  Medications Ordered in UC Medications - No data to display  Initial Impression / Assessment and Plan / UC Course  I have reviewed the triage vital signs and the nursing notes.  Pertinent labs & imaging results that were available during my care of the patient were reviewed by me and considered in my medical decision making (see chart for details).  Clinical Course as of Sep 12 1909  Mon Sep 12, 2020  1909 O2 Saturation 100%    [JZ]    Clinical Course User Index [JZ] Jodell Cipro, PA-C    Asthma exacerbation vs. COVID due to recent exposure.  COVID test pending.  O2 saturation 100%, stable at discharge.  She will continue with neb tx prn. Will send in prednisone and azithromycin.  Pt will self isolate pending COVID test results. If positive recommend MAB tx, discussed  today, phone number given for MAB hotline.  Final Clinical Impressions(s) / UC Diagnoses   Final diagnoses:  None   Discharge Instructions   None    ED Prescriptions    None     PDMP not reviewed this encounter.   Jodell Cipro, PA-C 09/12/20 1911

## 2020-09-12 NOTE — Discharge Instructions (Signed)
Take medications as prescribed Use nebulizer treatment as needed.  If you develop worsening shortness of breath go to the Emergency Dept.  If COVID positive recommend MAB treatment. The MAB hotline is [phone].

## 2020-09-12 NOTE — ED Triage Notes (Addendum)
Pt reports 2 days ago Sx's started Cough,Ha ,sore throat. Pt has not had a fever.Pt does have asthma and used nebulizer 4 x today . PT reported her employer told her to come for COVID test because a person at work was positive for COVID.

## 2020-09-13 LAB — SARS CORONAVIRUS 2 (TAT 6-24 HRS): SARS Coronavirus 2: NEGATIVE

## 2020-10-17 ENCOUNTER — Ambulatory Visit (HOSPITAL_COMMUNITY)
Admission: EM | Admit: 2020-10-17 | Discharge: 2020-10-17 | Disposition: A | Discharge status: Home / Self Care | Payer: HRSA Program | Attending: Urgent Care | Admitting: Urgent Care

## 2020-10-17 ENCOUNTER — Other Ambulatory Visit: Payer: Self-pay

## 2020-10-17 ENCOUNTER — Encounter (HOSPITAL_COMMUNITY): Payer: Self-pay | Admitting: Emergency Medicine

## 2020-10-17 DIAGNOSIS — U071 COVID-19: Secondary | ICD-10-CM | POA: Insufficient documentation

## 2020-10-17 DIAGNOSIS — Z20822 Contact with and (suspected) exposure to covid-19: Secondary | ICD-10-CM

## 2020-10-17 DIAGNOSIS — B349 Viral infection, unspecified: Secondary | ICD-10-CM

## 2020-10-17 MED ORDER — PROMETHAZINE-DM 6.25-15 MG/5ML PO SYRP: 5.0000 mL | ORAL_SOLUTION | Freq: Every evening | ORAL | 0 refills | Status: DC | PRN

## 2020-10-17 MED ORDER — ALBUTEROL SULFATE (2.5 MG/3ML) 0.083% IN NEBU: 2.5000 mg | INHALATION_SOLUTION | RESPIRATORY_TRACT | 0 refills | Status: DC | PRN

## 2020-10-17 MED ORDER — ALBUTEROL SULFATE HFA 108 (90 BASE) MCG/ACT IN AERS: 1.0000 | INHALATION_SPRAY | Freq: Four times a day (QID) | RESPIRATORY_TRACT | 0 refills | Status: DC | PRN

## 2020-10-17 MED ORDER — PSEUDOEPHEDRINE HCL 60 MG PO TABS: 60.0000 mg | ORAL_TABLET | Freq: Three times a day (TID) | ORAL | 0 refills | Status: DC | PRN

## 2020-10-17 MED ORDER — BENZONATATE 100 MG PO CAPS: 100.0000 mg | ORAL_CAPSULE | Freq: Three times a day (TID) | ORAL | 0 refills | Status: DC | PRN

## 2020-10-17 MED ORDER — CETIRIZINE HCL 10 MG PO TABS: 10.0000 mg | ORAL_TABLET | Freq: Every day | ORAL | 0 refills | Status: DC

## 2020-10-17 NOTE — ED Triage Notes (Signed)
Pt presents with sore throat and headache xs 4 days.

## 2020-10-17 NOTE — ED Provider Notes (Signed)
[name]   MRN: [id number] DOB: [birth year]  Subjective:   [name] is a 55 y.o. female presenting for 4-day history of a headache, sore throat.  Patient has had general malaise, had exposure to COVID-19 through a family member at home.  No current facility-administered medications for this encounter.  Current Outpatient Medications:  .  abacavir-dolutegravir-lamiVUDine (TRIUMEQ) 600-50-300 MG tablet, Take by mouth., Disp: , Rfl:  .  acetic acid-hydrocortisone (VOSOL-HC) OTIC solution, Place 4 drops into the right ear 2 (two) times daily., Disp: 10 mL, Rfl: 0 .  albuterol (PROVENTIL) (2.5 MG/3ML) 0.083% nebulizer solution, Take 3 mLs (2.5 mg total) by nebulization every 4 (four) hours as needed. For shortness of breath, Disp: 75 mL, Rfl: 2 .  albuterol (VENTOLIN HFA) 108 (90 Base) MCG/ACT inhaler, Inhale 1-2 puffs into the lungs every 6 (six) hours as needed for wheezing or shortness of breath., Disp: 18 g, Rfl: 2 .  azithromycin (ZITHROMAX Z-PAK) 250 MG tablet, Take 1 tablet (250 mg total) by mouth daily. Take two tablets on the first day and 1 tablet daily after., Disp: 6 tablet, Rfl: 0 .  benzonatate (TESSALON) 100 MG capsule, Take 1-2 capsules (100-200 mg total) by mouth 3 (three) times daily as needed., Disp: 60 capsule, Rfl: 0 .  cetirizine (ZYRTEC ALLERGY) 10 MG tablet, Take 1 tablet (10 mg total) by mouth daily., Disp: 30 tablet, Rfl: 0 .  fluticasone (FLONASE) 50 MCG/ACT nasal spray, Place 2 sprays into both nostrils daily., Disp: 16 g, Rfl: 0 .  fluticasone (FLOVENT HFA) 110 MCG/ACT inhaler, Inhale 2 puffs into the lungs 2 (two) times daily., Disp: 1 Inhaler, Rfl: 0 .  Fluticasone-Salmeterol (ADVAIR) 250-50 MCG/DOSE AEPB, Inhale 1 puff into the lungs every 12 (twelve) hours., Disp: 60 each, Rfl: 0 .  HYDROcodone-acetaminophen (NORCO) 7.5-325 MG tablet, Take 1 tablet by mouth every 6 (six) hours as needed for moderate pain., Disp: 15 tablet, Rfl:  0 .  meclizine (ANTIVERT) 25 MG tablet, Take 1 tablet (25 mg total) by mouth 3 (three) times daily as needed for dizziness., Disp: 30 tablet, Rfl: 0 .  montelukast (SINGULAIR) 10 MG tablet, Take 1 tablet (10 mg total) by mouth at bedtime., Disp: 30 tablet, Rfl: 3 .  naproxen (NAPROSYN) 500 MG tablet, Take 1 tablet (500 mg total) by mouth 2 (two) times daily., Disp: 30 tablet, Rfl: 0 .  ondansetron (ZOFRAN-ODT) 8 MG disintegrating tablet, Take 1 tablet (8 mg total) by mouth every 8 (eight) hours as needed for nausea or vomiting., Disp: 20 tablet, Rfl: 0 .  predniSONE (STERAPRED UNI-PAK 21 TAB) 10 MG (21) TBPK tablet, Dispense one 6 day pack. Take as directed with food., Disp: 21 tablet, Rfl: 0 .  promethazine-dextromethorphan (PROMETHAZINE-DM) 6.25-15 MG/5ML syrup, Take 5 mLs by mouth at bedtime as needed for cough., Disp: 100 mL, Rfl: 0 .  Spacer/Aero-Holding Chambers (AEROCHAMBER PLUS) inhaler, Use as instructed, Disp: 1 each, Rfl: 2 .  tiZANidine (ZANAFLEX) 4 MG tablet, Take 1-2 tablets (4-8 mg total) by mouth every 6 (six) hours as needed for muscle spasms., Disp: 21 tablet, Rfl: 0 .  triamcinolone cream (KENALOG) 0.1 %, Apply 1 application topically 2 (two) times daily., Disp: 30 g, Rfl: 0   No Known Allergies  Past Medical History:  Diagnosis Date  . Asthma   . GERD (gastroesophageal reflux disease)      Past Surgical History:  Procedure Laterality Date  . ABDOMINAL HYSTERECTOMY  History reviewed. No pertinent family history.  Social History   Tobacco Use  . Smoking status: Never Smoker  . Smokeless tobacco: Never Used  Substance Use Topics  . Alcohol use: No  . Drug use: No    ROS   Objective:   Vitals: BP 116/63 (BP Location: Left Arm)   Pulse 82   Temp 97.7 F (36.5 C) (Oral)   Resp 17   SpO2 100%   Physical Exam Constitutional:      General: She is not in acute distress.    Appearance: Normal appearance. She is well-developed. She is not ill-appearing,  toxic-appearing or diaphoretic.  HENT:     Head: Normocephalic and atraumatic.     Nose: Nose normal.     Mouth/Throat:     Mouth: Mucous membranes are moist.  Eyes:     Extraocular Movements: Extraocular movements intact.     Pupils: Pupils are equal, round, and reactive to light.  Cardiovascular:     Rate and Rhythm: Normal rate and regular rhythm.     Pulses: Normal pulses.     Heart sounds: Normal heart sounds. No murmur heard. No friction rub. No gallop.   Pulmonary:     Effort: Pulmonary effort is normal. No respiratory distress.     Breath sounds: Normal breath sounds. No stridor. No wheezing, rhonchi or rales.  Skin:    General: Skin is warm and dry.     Findings: No rash.  Neurological:     Mental Status: She is alert and oriented to person, place, and time.  Psychiatric:        Mood and Affect: Mood normal.        Behavior: Behavior normal.        Thought Content: Thought content normal.        Judgment: Judgment normal.     Assessment and Plan :   PDMP not reviewed this encounter.  1. Viral syndrome   2. Close exposure to COVID-19 virus     High suspicion for COVID-19 given close exposure.  Recommended supportive care. Counseled patient on potential for adverse effects with medications prescribed/recommended today, ER and return-to-clinic precautions discussed, patient verbalized understanding.    [name], [place] 10/17/20 1824

## 2020-10-18 LAB — SARS CORONAVIRUS 2 (TAT 6-24 HRS): SARS Coronavirus 2: POSITIVE — AB

## 2020-10-19 ENCOUNTER — Telehealth: Payer: Self-pay | Admitting: Infectious Diseases

## 2020-10-19 NOTE — Telephone Encounter (Signed)
Called to discuss with patient about COVID-19 symptoms and the use of one of the available treatments for those with mild to moderate Covid symptoms and at a high risk of hospitalization.  Pt appears to qualify for outpatient treatment due to co-morbid conditions and/or a member of an at-risk group in accordance with the FDA Emergency Use Authorization.    Symptom onset: 12/30? Per chart  Vaccinated: not documented on file  Qualifiers: HIV, SVI score 4, BMi < 25  Unable to reach pt - LVM and mychart sent    [employer]

## 2020-10-20 ENCOUNTER — Other Ambulatory Visit: Payer: Self-pay | Admitting: Nurse Practitioner

## 2020-10-20 ENCOUNTER — Telehealth: Payer: Self-pay | Admitting: Nurse Practitioner

## 2020-10-20 NOTE — Telephone Encounter (Signed)
I connected by phone with [name] on 10/20/2020 at 11:25 AM to discuss the potential use of a new treatment for mild to moderate COVID-19 viral infection in non-hospitalized patients.  This patient is a 55 y.o. female that meets the FDA criteria for Emergency Use Authorization of COVID monoclonal antibody casirivimab/imdevimab, bamlanivimab/etesevimab, or sotrovimab.  Has a (+) direct SARS-CoV-2 viral test result  Has mild or moderate COVID-19   Is NOT hospitalized due to COVID-19  Is within 7 days of symptom onset  Has at least one of the high risk factor(s) for progression to severe COVID-19 and/or hospitalization as defined in EUA.  Specific high risk criteria : BMI > 25, Immunosuppressive Disease or Treatment, Chronic Lung Disease and Other high risk medical condition per CDC:  SVI, no booster   I have spoken and communicated the following to the patient or parent/caregiver regarding COVID monoclonal antibody treatment:  1. FDA has authorized the emergency use for the treatment of mild to moderate COVID-19 in adults and pediatric patients with positive results of direct SARS-CoV-2 viral testing who are 85 years of age and older weighing at least 40 kg, and who are at high risk for progressing to severe COVID-19 and/or hospitalization.  2. The significant known and potential risks and benefits of COVID monoclonal antibody, and the extent to which such potential risks and benefits are unknown.  3. Information on available alternative treatments and the risks and benefits of those alternatives, including clinical trials.  4. Patients treated with COVID monoclonal antibody should continue to self-isolate and use infection control measures (e.g., wear mask, isolate, social distance, avoid sharing personal items, clean and disinfect "high touch" surfaces, and frequent handwashing) according to CDC guidelines.   5. The patient or parent/caregiver has the option to accept or  refuse COVID monoclonal antibody treatment.  After reviewing this information with the patient, the patient has agreed to receive one of the available covid 19 monoclonal antibodies and will be provided an appropriate fact sheet prior to infusion. Catherine Curet, NP 10/20/2020 11:25 AM

## 2020-10-21 ENCOUNTER — Ambulatory Visit (HOSPITAL_COMMUNITY)
Admission: RE | Admit: 2020-10-21 | Discharge: 2020-10-21 | Disposition: A | Discharge status: Home / Self Care | Payer: HRSA Program | Source: Ambulatory Visit | Attending: Pulmonary Disease | Admitting: Pulmonary Disease

## 2020-10-21 DIAGNOSIS — U071 COVID-19: Secondary | ICD-10-CM | POA: Insufficient documentation

## 2020-10-21 MED ORDER — SOTROVIMAB 500 MG/8ML IV SOLN
500.0000 mg | Freq: Once | INTRAVENOUS | Status: AC
  Administered 2020-10-21: 500 mg via INTRAVENOUS

## 2020-10-21 MED ORDER — SODIUM CHLORIDE 0.9 % IV SOLN: INTRAVENOUS | Status: DC | PRN

## 2020-10-21 MED ORDER — FAMOTIDINE IN NACL 20-0.9 MG/50ML-% IV SOLN: 20.0000 mg | Freq: Once | INTRAVENOUS | Status: DC | PRN

## 2020-10-21 MED ORDER — DIPHENHYDRAMINE HCL 50 MG/ML IJ SOLN: 50.0000 mg | Freq: Once | INTRAMUSCULAR | Status: DC | PRN

## 2020-10-21 MED ORDER — EPINEPHRINE 0.3 MG/0.3ML IJ SOAJ: 0.3000 mg | Freq: Once | INTRAMUSCULAR | Status: DC | PRN

## 2020-10-21 MED ORDER — ALBUTEROL SULFATE HFA 108 (90 BASE) MCG/ACT IN AERS: 2.0000 | INHALATION_SPRAY | Freq: Once | RESPIRATORY_TRACT | Status: DC | PRN

## 2020-10-21 MED ORDER — METHYLPREDNISOLONE SODIUM SUCC 125 MG IJ SOLR: 125.0000 mg | Freq: Once | INTRAMUSCULAR | Status: DC | PRN

## 2020-10-21 NOTE — Discharge Instructions (Signed)
10 Things You Can Do to Manage Your COVID-19 Symptoms at Home If you have possible or confirmed COVID-19: 1. Stay home from work and school. And stay away from other public places. If you must go out, avoid using any kind of public transportation, ridesharing, or taxis. 2. Monitor your symptoms carefully. If your symptoms get worse, call your healthcare provider immediately. 3. Get rest and stay hydrated. 4. If you have a medical appointment, call the healthcare provider ahead of time and tell them that you have or may have COVID-19. 5. For medical emergencies, call 911 and notify the dispatch personnel that you have or may have COVID-19. 6. Cover your cough and sneezes with a tissue or use the inside of your elbow. 7. Wash your hands often with soap and water for at least 20 seconds or clean your hands with an alcohol-based hand sanitizer that contains at least 60% alcohol. 8. As much as possible, stay in a specific room and away from other people in your home. Also, you should use a separate bathroom, if available. If you need to be around other people in or outside of the home, wear a mask. 9. Avoid sharing personal items with other people in your household, like dishes, towels, and bedding. 10. Clean all surfaces that are touched often, like counters, tabletops, and doorknobs. Use household cleaning sprays or wipes according to the label instructions. cdc.gov/coronavirus 04/15/2019 This information is not intended to replace advice given to you by your health care provider. Make sure you discuss any questions you have with your health care provider. Document Revised: 09/17/2019 Document Reviewed: 09/17/2019 Elsevier Patient Education  2020 Elsevier Inc. What types of side effects do monoclonal antibody drugs cause?  Common side effects  In general, the more common side effects caused by monoclonal antibody drugs include: . Allergic reactions, such as hives or itching . Flu-like signs and  symptoms, including chills, fatigue, fever, and muscle aches and pains . Nausea, vomiting . Diarrhea . Skin rashes . Low blood pressure   The CDC is recommending patients who receive monoclonal antibody treatments wait at least 90 days before being vaccinated.  Currently, there are no data on the safety and efficacy of mRNA COVID-19 vaccines in persons who received monoclonal antibodies or convalescent plasma as part of COVID-19 treatment. Based on the estimated half-life of such therapies as well as evidence suggesting that reinfection is uncommon in the 90 days after initial infection, vaccination should be deferred for at least 90 days, as a precautionary measure until additional information becomes available, to avoid interference of the antibody treatment with vaccine-induced immune responses. If you have any questions or concerns after the infusion please call the Advanced Practice Provider on call at [phone]. This number is ONLY intended for your use regarding questions or concerns about the infusion post-treatment side-effects.  Please do not provide this number to others for use. For return to work notes please contact your primary care provider.   If someone you know is interested in receiving treatment please have them call the COVID hotline at [phone].   

## 2020-10-21 NOTE — Progress Notes (Signed)
Diagnosis: COVID-19  Physician: Dr. Patrick Wright  Procedure: [facility] Med: Sotrovimab infusion - Provided patient with sotrovimab fact sheet for patients, parents, and caregivers prior to infusion.   Complications: No immediate complications noted  Discharge: Discharged home    

## 2020-10-21 NOTE — Progress Notes (Signed)
Patient reviewed Fact Sheet for Patients, Parents, and Caregivers for Emergency Use Authorization (EUA) of sotrovimab for the Treatment of Coronavirus. Patient also reviewed and is agreeable to the estimated cost of treatment. Patient is agreeable to proceed.   

## 2021-07-22 ENCOUNTER — Other Ambulatory Visit: Payer: Self-pay

## 2021-07-22 ENCOUNTER — Ambulatory Visit (HOSPITAL_COMMUNITY)
Admission: EM | Admit: 2021-07-22 | Discharge: 2021-07-22 | Disposition: A | Discharge status: Home / Self Care | Payer: Self-pay | Attending: Family Medicine | Admitting: Family Medicine

## 2021-07-22 DIAGNOSIS — Z7951 Long term (current) use of inhaled steroids: Secondary | ICD-10-CM | POA: Insufficient documentation

## 2021-07-22 DIAGNOSIS — Z20822 Contact with and (suspected) exposure to covid-19: Secondary | ICD-10-CM | POA: Insufficient documentation

## 2021-07-22 DIAGNOSIS — J4541 Moderate persistent asthma with (acute) exacerbation: Secondary | ICD-10-CM | POA: Insufficient documentation

## 2021-07-22 DIAGNOSIS — J069 Acute upper respiratory infection, unspecified: Secondary | ICD-10-CM | POA: Insufficient documentation

## 2021-07-22 LAB — SARS CORONAVIRUS 2 (TAT 6-24 HRS): SARS Coronavirus 2: NEGATIVE

## 2021-07-22 LAB — POC INFLUENZA A AND B ANTIGEN (URGENT CARE ONLY)
INFLUENZA A ANTIGEN, POC: NEGATIVE
INFLUENZA B ANTIGEN, POC: NEGATIVE

## 2021-07-22 MED ORDER — DEXAMETHASONE SODIUM PHOSPHATE 10 MG/ML IJ SOLN
10.0000 mg | Freq: Once | INTRAMUSCULAR | Status: AC
  Administered 2021-07-22: 10 mg via INTRAMUSCULAR

## 2021-07-22 MED ORDER — PROMETHAZINE-DM 6.25-15 MG/5ML PO SYRP: 5.0000 mL | ORAL_SOLUTION | Freq: Four times a day (QID) | ORAL | 0 refills | Status: DC | PRN

## 2021-07-22 MED ORDER — ALBUTEROL SULFATE HFA 108 (90 BASE) MCG/ACT IN AERS
INHALATION_SPRAY | RESPIRATORY_TRACT | Status: AC
  Filled 2021-07-22: qty 6.7

## 2021-07-22 MED ORDER — FLUTICASONE PROPIONATE HFA 110 MCG/ACT IN AERO: 2.0000 | INHALATION_SPRAY | Freq: Two times a day (BID) | RESPIRATORY_TRACT | 2 refills | Status: AC

## 2021-07-22 MED ORDER — ALBUTEROL SULFATE HFA 108 (90 BASE) MCG/ACT IN AERS
2.0000 | INHALATION_SPRAY | Freq: Once | RESPIRATORY_TRACT | Status: AC
  Administered 2021-07-22: 2 via RESPIRATORY_TRACT

## 2021-07-22 MED ORDER — DEXAMETHASONE SODIUM PHOSPHATE 10 MG/ML IJ SOLN
INTRAMUSCULAR | Status: AC
  Filled 2021-07-22: qty 1

## 2021-07-22 NOTE — ED Triage Notes (Signed)
Pt reports asthma problems ,sorethroat and body aches for 3 days.

## 2021-07-22 NOTE — ED Provider Notes (Signed)
[facility]    CSN: [id number] Arrival date & time: 07/22/21  1019      History   Chief Complaint Chief Complaint  Patient presents with   Shortness of Breath   Generalized Body Aches   Sore Throat    HPI [name] [facility] is a 55 y.o. female.   Patient presenting today with 3-day history of generalized body aches, fevers, sore throat, congestion, cough, chest tightness, wheezing, shortness of breath.  She denies chest pain, abdominal pain, nausea vomiting diarrhea.  Has been around several sick contacts recently but unsure with what.  History of significant asthma and seasonal allergies, on albuterol nebulizers but has been out of her Flovent for quite some time.   Past Medical History:  Diagnosis Date   Asthma    GERD (gastroesophageal reflux disease)     Patient Active Problem List   Diagnosis Date Noted   HIV positive (HCC) 12/13/2019   Asthma, chronic 12/13/2019   Environmental allergies 12/13/2019   Spondylolisthesis at L5-S1 level 12/13/2019    Past Surgical History:  Procedure Laterality Date   ABDOMINAL HYSTERECTOMY      OB History   No obstetric history on file.      Home Medications    Prior to Admission medications   Medication Sig Start Date End Date Taking? Authorizing Provider  promethazine-dextromethorphan (PROMETHAZINE-DM) 6.25-15 MG/5ML syrup Take 5 mLs by mouth 4 (four) times daily as needed for cough. 07/22/21  Yes Particia Nearing, PA-C  abacavir-dolutegravir-lamiVUDine (TRIUMEQ) 600-50-300 MG tablet Take by mouth. 11/29/16   [provider]  acetic acid-hydrocortisone (VOSOL-HC) OTIC solution Place 4 drops into the right ear 2 (two) times daily. Patient not taking: Reported on 10/20/2020 08/11/19   Domenick Gong, MD  albuterol (PROVENTIL) (2.5 MG/3ML) 0.083% nebulizer solution Take 3 mLs (2.5 mg total) by nebulization every 4 (four) hours as needed. For shortness of breath 10/17/20   Wallis Bamberg, PA-C   albuterol (VENTOLIN HFA) 108 (90 Base) MCG/ACT inhaler Inhale 1-2 puffs into the lungs every 6 (six) hours as needed for wheezing or shortness of breath. 10/17/20   Wallis Bamberg, PA-C  azithromycin (ZITHROMAX Z-PAK) 250 MG tablet Take 1 tablet (250 mg total) by mouth daily. Take two tablets on the first day and 1 tablet daily after. Patient not taking: Reported on 10/20/2020 09/12/20   Ward, Tylene Fantasia, PA-C  benzonatate (TESSALON) 100 MG capsule Take 1-2 capsules (100-200 mg total) by mouth 3 (three) times daily as needed. Patient not taking: Reported on 10/20/2020 10/17/20   Wallis Bamberg, PA-C  cetirizine (ZYRTEC ALLERGY) 10 MG tablet Take 1 tablet (10 mg total) by mouth daily. 10/17/20   Wallis Bamberg, PA-C  fluticasone (FLONASE) 50 MCG/ACT nasal spray Place 2 sprays into both nostrils daily. Patient not taking: Reported on 10/20/2020 05/06/20   Wallis Bamberg, PA-C  fluticasone [facility] HFA) 110 MCG/ACT inhaler Inhale 2 puffs into the lungs 2 (two) times daily. 07/22/21   Particia Nearing, PA-C  Fluticasone-Salmeterol (ADVAIR) 250-50 MCG/DOSE AEPB Inhale 1 puff into the lungs every 12 (twelve) hours. 08/11/19   Domenick Gong, MD  HYDROcodone-acetaminophen (NORCO) 7.5-325 MG tablet Take 1 tablet by mouth every 6 (six) hours as needed for moderate pain. 12/13/19   Eustace Moore, MD  meclizine (ANTIVERT) 25 MG tablet Take 1 tablet (25 mg total) by mouth 3 (three) times daily as needed for dizziness. Patient not taking: Reported on 10/20/2020 08/11/19   Domenick Gong, MD  montelukast (SINGULAIR) 10 MG  tablet Take 1 tablet (10 mg total) by mouth at bedtime. 05/06/20   Wallis Bamberg, PA-C  naproxen (NAPROSYN) 500 MG tablet Take 1 tablet (500 mg total) by mouth 2 (two) times daily. Patient not taking: Reported on 10/20/2020 12/07/18   Ronnie Doss A, PA-C  ondansetron (ZOFRAN-ODT) 8 MG disintegrating tablet Take 1 tablet (8 mg total) by mouth every 8 (eight) hours as needed for nausea or vomiting. Patient not  taking: Reported on 10/20/2020 05/06/20   Wallis Bamberg, PA-C  predniSONE (STERAPRED UNI-PAK 21 TAB) 10 MG (21) TBPK tablet Dispense one 6 day pack. Take as directed with food. Patient not taking: Reported on 10/20/2020 09/12/20   Ward, Tylene Fantasia, PA-C  promethazine-dextromethorphan (PROMETHAZINE-DM) 6.25-15 MG/5ML syrup Take 5 mLs by mouth at bedtime as needed for cough. 10/17/20   Wallis Bamberg, PA-C  pseudoephedrine (SUDAFED) 60 MG tablet Take 1 tablet (60 mg total) by mouth every 8 (eight) hours as needed for congestion. 10/17/20   Wallis Bamberg, PA-C  Spacer/Aero-Holding Chambers (AEROCHAMBER PLUS) inhaler Use as instructed Patient not taking: Reported on 10/20/2020 08/11/19   Domenick Gong, MD  tiZANidine (ZANAFLEX) 4 MG tablet Take 1-2 tablets (4-8 mg total) by mouth every 6 (six) hours as needed for muscle spasms. Patient not taking: Reported on 10/20/2020 12/13/19   Eustace Moore, MD  triamcinolone cream (KENALOG) 0.1 % Apply 1 application topically 2 (two) times daily. 07/26/18   Mardella Layman, MD    Family History No family history on file.  Social History Social History   Tobacco Use   Smoking status: Never   Smokeless tobacco: Never  Substance Use Topics   Alcohol use: No   Drug use: No     Allergies   Patient has no known allergies.   Review of Systems Review of Systems Per HPI  Physical Exam Triage Vital Signs ED Triage Vitals  Enc Vitals Group     BP 07/22/21 1121 139/82     Pulse Rate 07/22/21 1121 95     Resp 07/22/21 1121 (!) 22     Temp 07/22/21 1121 98 F (36.7 C)     Temp src --      SpO2 07/22/21 1121 98 %     Weight --      Height --      Head Circumference --      Peak Flow --      Pain Score 07/22/21 1122 10     Pain Loc --      Pain Edu? --      Excl. in GC? --    No data found.  Updated Vital Signs BP 139/82   Pulse 95   Temp 98 F (36.7 C)   Resp (!) 22   SpO2 98%   Visual Acuity Right Eye Distance:   Left Eye Distance:   Bilateral  Distance:    Right Eye Near:   Left Eye Near:    Bilateral Near:     Physical Exam Vitals and nursing note reviewed.  Constitutional:      Appearance: Normal appearance. She is not ill-appearing.  HENT:     Head: Atraumatic.     Right Ear: Tympanic membrane normal.     Left Ear: Tympanic membrane normal.     Nose: Rhinorrhea present.     Mouth/Throat:     Mouth: Mucous membranes are moist.     Pharynx: Posterior oropharyngeal erythema present.  Eyes:     Extraocular Movements: Extraocular movements intact.  Conjunctiva/sclera: Conjunctivae normal.  Cardiovascular:     Rate and Rhythm: Normal rate and regular rhythm.     Heart sounds: Normal heart sounds.  Pulmonary:     Effort: Pulmonary effort is normal.     Breath sounds: Wheezing present. No rales.  Abdominal:     General: Bowel sounds are normal. There is no distension.     Palpations: Abdomen is soft.     Tenderness: There is no abdominal tenderness. There is no guarding.  Musculoskeletal:        General: Normal range of motion.     Cervical back: Normal range of motion and neck supple.  Skin:    General: Skin is warm and dry.  Neurological:     Mental Status: She is alert and oriented to person, place, and time.     Motor: No weakness.     Gait: Gait normal.  Psychiatric:        Mood and Affect: Mood normal.        Thought Content: Thought content normal.        Judgment: Judgment normal.   UC Treatments / Results  Labs (all labs ordered are listed, but only abnormal results are displayed) Labs Reviewed  SARS CORONAVIRUS 2 (TAT 6-24 HRS)  POC INFLUENZA A AND B ANTIGEN (URGENT CARE ONLY)    EKG   Radiology No results found.  Procedures Procedures (including critical care time)  Medications Ordered in UC Medications  dexamethasone (DECADRON) injection 10 mg (10 mg Intramuscular Given 07/22/21 1203)  albuterol (VENTOLIN HFA) 108 (90 Base) MCG/ACT inhaler 2 puff (2 puffs Inhalation Given 07/22/21  1202)    Initial Impression / Assessment and Plan / UC Course  I have reviewed the triage vital signs and the nursing notes.  Pertinent labs & imaging results that were available during my care of the patient were reviewed by me and considered in my medical decision making (see chart for details).     Overall in no acute distress and oxygen saturation room air 98%.  Lung sounds significantly improved after IM Decadron and several puffs of albuterol inhaler in clinic.  Rapid flu testing negative, COVID PCR pending.  Will restart Flovent inhaler, give Phenergan DM and continue albuterol nebulizers and inhaler as needed.  Return for acutely worsening symptoms.  Work note given with quarantine protocol.  Final Clinical Impressions(s) / UC Diagnoses   Final diagnoses:  Viral URI with cough  Moderate persistent asthma with acute exacerbation   Discharge Instructions   None    ED Prescriptions     Medication Sig Dispense Auth. Provider   fluticasone (FLOVENT HFA) 110 MCG/ACT inhaler Inhale 2 puffs into the lungs 2 (two) times daily. 1 each Particia Nearing, PA-C   promethazine-dextromethorphan (PROMETHAZINE-DM) 6.25-15 MG/5ML syrup Take 5 mLs by mouth 4 (four) times daily as needed for cough. 100 mL Particia Nearing, [place]      PDMP not reviewed this encounter.   Particia Nearing, [place] 07/22/21 1309

## 2021-08-24 ENCOUNTER — Encounter (HOSPITAL_COMMUNITY): Payer: Self-pay | Admitting: Emergency Medicine

## 2021-08-24 ENCOUNTER — Other Ambulatory Visit: Payer: Self-pay

## 2021-08-24 ENCOUNTER — Ambulatory Visit (HOSPITAL_COMMUNITY)
Admission: EM | Admit: 2021-08-24 | Discharge: 2021-08-24 | Disposition: A | Discharge status: Home / Self Care | Payer: Self-pay | Attending: Family Medicine | Admitting: Family Medicine

## 2021-08-24 DIAGNOSIS — M79601 Pain in right arm: Secondary | ICD-10-CM

## 2021-08-24 DIAGNOSIS — W19XXXA Unspecified fall, initial encounter: Secondary | ICD-10-CM

## 2021-08-24 DIAGNOSIS — M25551 Pain in right hip: Secondary | ICD-10-CM

## 2021-08-24 DIAGNOSIS — M79604 Pain in right leg: Secondary | ICD-10-CM

## 2021-08-24 MED ORDER — CYCLOBENZAPRINE HCL 10 MG PO TABS: 10.0000 mg | ORAL_TABLET | Freq: Three times a day (TID) | ORAL | 0 refills | Status: DC | PRN

## 2021-08-24 MED ORDER — NAPROXEN 500 MG PO TABS: 500.0000 mg | ORAL_TABLET | Freq: Two times a day (BID) | ORAL | 0 refills | Status: DC | PRN

## 2021-08-24 NOTE — ED Provider Notes (Signed)
[facility]    CSN: [id number] Arrival date & time: 08/24/21  1726      History   Chief Complaint Chief Complaint  Patient presents with   Fall   Leg Pain   Arm Pain    HPI [name] is a 55 y.o. female.   Presenting today with right upper extremity, right hip and right lower extremity pain after a fall that occurred this afternoon.  She states she slipped and fell onto her right side diffusely.  She did not hit her head or lose consciousness.  She denies any dizziness, altered mental status, inability to move extremities, saddle paresthesias, bowel or bladder incontinence, numbness, tingling, weakness.  She states she is able to move everything but is very painful to do so and her muscles feel very stiff and sore.  She has so far not taken anything for symptoms as she came straight here.   Past Medical History:  Diagnosis Date   Asthma    GERD (gastroesophageal reflux disease)     Patient Active Problem List   Diagnosis Date Noted   HIV positive (HCC) 12/13/2019   Asthma, chronic 12/13/2019   Environmental allergies 12/13/2019   Spondylolisthesis at L5-S1 level 12/13/2019    Past Surgical History:  Procedure Laterality Date   ABDOMINAL HYSTERECTOMY      OB History   No obstetric history on file.      Home Medications    Prior to Admission medications   Medication Sig Start Date End Date Taking? Authorizing Provider  cyclobenzaprine (FLEXERIL) 10 MG tablet Take 1 tablet (10 mg total) by mouth 3 (three) times daily as needed for muscle spasms. Not drink alcohol or drive while taking this medication.  May cause drowsiness. 08/24/21  Yes Particia Nearing, PA-C  naproxen (NAPROSYN) 500 MG tablet Take 1 tablet (500 mg total) by mouth 2 (two) times daily as needed. 08/24/21  Yes Particia Nearing, PA-C  abacavir-dolutegravir-lamiVUDine (TRIUMEQ) 600-50-300 MG tablet Take by mouth. 11/29/16   [provider]  acetic  acid-hydrocortisone (VOSOL-HC) OTIC solution Place 4 drops into the right ear 2 (two) times daily. Patient not taking: Reported on 10/20/2020 08/11/19   Domenick Gong, MD  albuterol (PROVENTIL) (2.5 MG/3ML) 0.083% nebulizer solution Take 3 mLs (2.5 mg total) by nebulization every 4 (four) hours as needed. For shortness of breath 10/17/20   Wallis Bamberg, PA-C  albuterol (VENTOLIN HFA) 108 (90 Base) MCG/ACT inhaler Inhale 1-2 puffs into the lungs every 6 (six) hours as needed for wheezing or shortness of breath. 10/17/20   Wallis Bamberg, PA-C  azithromycin (ZITHROMAX Z-PAK) 250 MG tablet Take 1 tablet (250 mg total) by mouth daily. Take two tablets on the first day and 1 tablet daily after. Patient not taking: No sig reported 09/12/20   Ward, Tylene Fantasia, PA-C  benzonatate (TESSALON) 100 MG capsule Take 1-2 capsules (100-200 mg total) by mouth 3 (three) times daily as needed. Patient not taking: No sig reported 10/17/20   Wallis Bamberg, PA-C  cetirizine (ZYRTEC ALLERGY) 10 MG tablet Take 1 tablet (10 mg total) by mouth daily. 10/17/20   Wallis Bamberg, PA-C  fluticasone (FLONASE) 50 MCG/ACT nasal spray Place 2 sprays into both nostrils daily. Patient not taking: Reported on 10/20/2020 05/06/20   Wallis Bamberg, PA-C  fluticasone [facility] HFA) 110 MCG/ACT inhaler Inhale 2 puffs into the lungs 2 (two) times daily. 07/22/21   Particia Nearing, PA-C  Fluticasone-Salmeterol (ADVAIR) 250-50 MCG/DOSE AEPB Inhale 1 puff into  the lungs every 12 (twelve) hours. 08/11/19   Domenick Gong, MD  HYDROcodone-acetaminophen (NORCO) 7.5-325 MG tablet Take 1 tablet by mouth every 6 (six) hours as needed for moderate pain. 12/13/19   Eustace Moore, MD  meclizine (ANTIVERT) 25 MG tablet Take 1 tablet (25 mg total) by mouth 3 (three) times daily as needed for dizziness. Patient not taking: No sig reported 08/11/19   Domenick Gong, MD  montelukast (SINGULAIR) 10 MG tablet Take 1 tablet (10 mg total) by mouth at bedtime. 05/06/20    Wallis Bamberg, PA-C  naproxen (NAPROSYN) 500 MG tablet Take 1 tablet (500 mg total) by mouth 2 (two) times daily. Patient not taking: No sig reported 12/07/18   Ronnie Doss A, PA-C  ondansetron (ZOFRAN-ODT) 8 MG disintegrating tablet Take 1 tablet (8 mg total) by mouth every 8 (eight) hours as needed for nausea or vomiting. Patient not taking: No sig reported 05/06/20   Wallis Bamberg, PA-C  predniSONE (STERAPRED UNI-PAK 21 TAB) 10 MG (21) TBPK tablet Dispense one 6 day pack. Take as directed with food. Patient not taking: No sig reported 09/12/20   Ward, Tylene Fantasia, PA-C  promethazine-dextromethorphan (PROMETHAZINE-DM) 6.25-15 MG/5ML syrup Take 5 mLs by mouth at bedtime as needed for cough. 10/17/20   Wallis Bamberg, PA-C  promethazine-dextromethorphan (PROMETHAZINE-DM) 6.25-15 MG/5ML syrup Take 5 mLs by mouth 4 (four) times daily as needed for cough. 07/22/21   Particia Nearing, PA-C  pseudoephedrine (SUDAFED) 60 MG tablet Take 1 tablet (60 mg total) by mouth every 8 (eight) hours as needed for congestion. 10/17/20   Wallis Bamberg, PA-C  Spacer/Aero-Holding Chambers (AEROCHAMBER PLUS) inhaler Use as instructed Patient not taking: No sig reported 08/11/19   Domenick Gong, MD  tiZANidine (ZANAFLEX) 4 MG tablet Take 1-2 tablets (4-8 mg total) by mouth every 6 (six) hours as needed for muscle spasms. Patient not taking: No sig reported 12/13/19   Eustace Moore, MD  triamcinolone cream (KENALOG) 0.1 % Apply 1 application topically 2 (two) times daily. 07/26/18   Mardella Layman, MD    Family History History reviewed. No pertinent family history.  Social History Social History   Tobacco Use   Smoking status: Never   Smokeless tobacco: Never  Substance Use Topics   Alcohol use: No   Drug use: No     Allergies   Patient has no known allergies.   Review of Systems Review of Systems Per HPI  Physical Exam Triage Vital Signs ED Triage Vitals  Enc Vitals Group     BP      Pulse       Resp      Temp      Temp src      SpO2      Weight      Height      Head Circumference      Peak Flow      Pain Score      Pain Loc      Pain Edu?      Excl. in GC?    No data found.  Updated Vital Signs BP (!) 142/89   Pulse (!) 101   Temp 98.4 F (36.9 C)   Resp 18   SpO2 97%   Visual Acuity Right Eye Distance:   Left Eye Distance:   Bilateral Distance:    Right Eye Near:   Left Eye Near:    Bilateral Near:     Physical Exam Vitals and nursing note reviewed.  Constitutional:      Appearance: Normal appearance. She is not ill-appearing.  HENT:     Head: Atraumatic.  Eyes:     Extraocular Movements: Extraocular movements intact.     Conjunctiva/sclera: Conjunctivae normal.  Cardiovascular:     Rate and Rhythm: Normal rate and regular rhythm.     Heart sounds: Normal heart sounds.  Pulmonary:     Effort: Pulmonary effort is normal.     Breath sounds: Normal breath sounds.  Musculoskeletal:        General: Tenderness and signs of injury present. No swelling or deformity. Normal range of motion.     Cervical back: Normal range of motion and neck supple.     Comments: No midline spinal tenderness to palpation diffusely.  Negative straight leg raise bilaterally.  Full range of motion intact though very painful per patient.  Significant diffuse tenderness to palpation right shoulder, deltoid into bicep, elbow, wrist, hand, right hip, lateral leg and knee.  Skin:    General: Skin is warm and dry.     Findings: No bruising or erythema.  Neurological:     Mental Status: She is alert and oriented to person, place, and time.     Comments: All 4 extremities neurovascularly intact  Psychiatric:        Mood and Affect: Mood normal.        Thought Content: Thought content normal.        Judgment: Judgment normal.   UC Treatments / Results  Labs (all labs ordered are listed, but only abnormal results are displayed) Labs Reviewed - No data to  display  EKG  Radiology No results found.  Procedures Procedures (including critical care time)  Medications Ordered in UC Medications - No data to display  Initial Impression / Assessment and Plan / UC Course  I have reviewed the triage vital signs and the nursing notes.  Pertinent labs & imaging results that were available during my care of the patient were reviewed by me and considered in my medical decision making (see chart for details).     Consistent with contusions and muscle strain from a fall.  No evidence of bony injury from the incident and did not hit head or lose consciousness.  Discussed naproxen, Flexeril, rest, heat, stretches.  Work note given.  Return for acutely worsening symptoms.  Final Clinical Impressions(s) / UC Diagnoses   Final diagnoses:  Right arm pain  Right leg pain  Right hip pain  Fall, initial encounter   Discharge Instructions   None    ED Prescriptions     Medication Sig Dispense Auth. Provider   cyclobenzaprine (FLEXERIL) 10 MG tablet Take 1 tablet (10 mg total) by mouth 3 (three) times daily as needed for muscle spasms. Not drink alcohol or drive while taking this medication.  May cause drowsiness. 15 tablet Particia Nearing, [place]   naproxen (NAPROSYN) 500 MG tablet Take 1 tablet (500 mg total) by mouth 2 (two) times daily as needed. 30 tablet Particia Nearing, [place]      PDMP not reviewed this encounter.   Particia Nearing, [place] 08/24/21 1950

## 2021-08-24 NOTE — ED Triage Notes (Signed)
Pt is present today with right arm and right pain from a fall. Pt states that she slipped and feel in the grocery store.  Pt denies hitting her head or LOC

## 2021-11-13 ENCOUNTER — Other Ambulatory Visit: Payer: Self-pay

## 2021-11-13 ENCOUNTER — Encounter (HOSPITAL_COMMUNITY): Payer: Self-pay

## 2021-11-13 ENCOUNTER — Ambulatory Visit (HOSPITAL_COMMUNITY)
Admission: RE | Admit: 2021-11-13 | Discharge: 2021-11-13 | Disposition: A | Discharge status: Home / Self Care | Payer: Self-pay | Source: Ambulatory Visit | Attending: Family Medicine | Admitting: Family Medicine

## 2021-11-13 VITALS — BP 119/52 | HR 92 | Temp 98.1°F | Resp 18

## 2021-11-13 DIAGNOSIS — J069 Acute upper respiratory infection, unspecified: Secondary | ICD-10-CM | POA: Insufficient documentation

## 2021-11-13 DIAGNOSIS — Z20822 Contact with and (suspected) exposure to covid-19: Secondary | ICD-10-CM | POA: Insufficient documentation

## 2021-11-13 DIAGNOSIS — R052 Subacute cough: Secondary | ICD-10-CM | POA: Insufficient documentation

## 2021-11-13 LAB — POC INFLUENZA A AND B ANTIGEN (URGENT CARE ONLY)
INFLUENZA A ANTIGEN, POC: NEGATIVE
INFLUENZA B ANTIGEN, POC: NEGATIVE

## 2021-11-13 NOTE — Discharge Instructions (Signed)
You were seen today for upper respiratory symptoms.  You flu swab was negative today.  You covid swab is pending and will be resulted tomorrow.  This seems viral in nature.  For now, I recommend over the counter medication such as clartin or zyrtec to help with itchy eyes and nasal congestion/drainage.  You may use over the counter cough syrup to help with your cough, such as delsym or mucinex.  Please use tylenol for pain and fever.  Please get plenty of rest and fluids.

## 2021-11-13 NOTE — ED Provider Notes (Signed)
[facility]    CSN: [id number] Arrival date & time: 11/13/21  1340      History   Chief Complaint Chief Complaint  Patient presents with   APPOINTMENT: URI    HPI [name] is a 56 y.o. female.   Patient is here for sore throat, runny nose, sinus congestion.  Hard time sleeping last night.  This started about 2 days ago.   + headache, sore throat.  + cough.  Mild sob, no wheezing.  She uses her inhalers regularly.  + nausea.  No fever.  Using motrin, singular.   Baby has a cold, but no known flu/covid exposure.    Past Medical History:  Diagnosis Date   Asthma    GERD (gastroesophageal reflux disease)     Patient Active Problem List   Diagnosis Date Noted   HIV positive (HCC) 12/13/2019   Asthma, chronic 12/13/2019   Environmental allergies 12/13/2019   Spondylolisthesis at L5-S1 level 12/13/2019    Past Surgical History:  Procedure Laterality Date   ABDOMINAL HYSTERECTOMY      OB History   No obstetric history on file.      Home Medications    Prior to Admission medications   Medication Sig Start Date End Date Taking? Authorizing Provider  abacavir-dolutegravir-lamiVUDine (TRIUMEQ) 600-50-300 MG tablet Take by mouth. 11/29/16   [provider]  albuterol (PROVENTIL) (2.5 MG/3ML) 0.083% nebulizer solution Take 3 mLs (2.5 mg total) by nebulization every 4 (four) hours as needed. For shortness of breath 10/17/20   Wallis Bamberg, PA-C  albuterol (VENTOLIN HFA) 108 (90 Base) MCG/ACT inhaler Inhale 1-2 puffs into the lungs every 6 (six) hours as needed for wheezing or shortness of breath. 10/17/20   Wallis Bamberg, PA-C  cetirizine (ZYRTEC ALLERGY) 10 MG tablet Take 1 tablet (10 mg total) by mouth daily. 10/17/20   Wallis Bamberg, PA-C  cyclobenzaprine (FLEXERIL) 10 MG tablet Take 1 tablet (10 mg total) by mouth 3 (three) times daily as needed for muscle spasms. Not drink alcohol or drive while taking this medication.  May cause drowsiness.  08/24/21   Particia Nearing, PA-C  fluticasone (FLOVENT HFA) 110 MCG/ACT inhaler Inhale 2 puffs into the lungs 2 (two) times daily. 07/22/21   Particia Nearing, PA-C  Fluticasone-Salmeterol (ADVAIR) 250-50 MCG/DOSE AEPB Inhale 1 puff into the lungs every 12 (twelve) hours. 08/11/19   Domenick Gong, MD  montelukast (SINGULAIR) 10 MG tablet Take 1 tablet (10 mg total) by mouth at bedtime. 05/06/20   Wallis Bamberg, PA-C  naproxen (NAPROSYN) 500 MG tablet Take 1 tablet (500 mg total) by mouth 2 (two) times daily as needed. 08/24/21   Particia Nearing, PA-C  promethazine-dextromethorphan (PROMETHAZINE-DM) 6.25-15 MG/5ML syrup Take 5 mLs by mouth at bedtime as needed for cough. 10/17/20   Wallis Bamberg, PA-C  promethazine-dextromethorphan (PROMETHAZINE-DM) 6.25-15 MG/5ML syrup Take 5 mLs by mouth 4 (four) times daily as needed for cough. 07/22/21   Particia Nearing, PA-C  pseudoephedrine (SUDAFED) 60 MG tablet Take 1 tablet (60 mg total) by mouth every 8 (eight) hours as needed for congestion. 10/17/20   Wallis Bamberg, PA-C  Spacer/Aero-Holding Chambers (AEROCHAMBER PLUS) inhaler Use as instructed Patient not taking: No sig reported 08/11/19   Domenick Gong, MD    Family History History reviewed. No pertinent family history.  Social History Social History   Tobacco Use   Smoking status: Never   Smokeless tobacco: Never  Substance Use Topics   Alcohol use: No  Drug use: No     Allergies   Patient has no known allergies.   Review of Systems Review of Systems  Constitutional:  Positive for fatigue. Negative for fever.  HENT:  Positive for congestion, rhinorrhea, sinus pressure, sinus pain and sore throat.   Eyes:  Positive for discharge and itching.  Respiratory:  Positive for cough. Negative for wheezing.   Cardiovascular: Negative.   Gastrointestinal:  Positive for nausea.  Genitourinary: Negative.     Physical Exam Triage Vital Signs ED Triage Vitals  Enc  Vitals Group     BP 11/13/21 1418 (!) 119/52     Pulse Rate 11/13/21 1418 92     Resp 11/13/21 1418 18     Temp 11/13/21 1418 98.1 F (36.7 C)     Temp Source 11/13/21 1418 Oral     SpO2 11/13/21 1418 97 %     Weight --      Height --      Head Circumference --      Peak Flow --      Pain Score 11/13/21 1419 4     Pain Loc --      Pain Edu? --      Excl. in GC? --    No data found.  Updated Vital Signs BP (!) 119/52 (BP Location: Left Arm)    Pulse 92    Temp 98.1 F (36.7 C) (Oral)    Resp 18    SpO2 97%   Visual Acuity Right Eye Distance:   Left Eye Distance:   Bilateral Distance:    Right Eye Near:   Left Eye Near:    Bilateral Near:     Physical Exam Constitutional:      Appearance: Normal appearance.  HENT:     Head: Normocephalic and atraumatic.     Right Ear: Tympanic membrane normal.     Left Ear: Tympanic membrane normal.     Nose: Congestion and rhinorrhea present.     Right Sinus: Maxillary sinus tenderness and frontal sinus tenderness present.     Left Sinus: Maxillary sinus tenderness and frontal sinus tenderness present.     Mouth/Throat:     Mouth: Mucous membranes are moist.     Pharynx: Posterior oropharyngeal erythema present. No oropharyngeal exudate.  Cardiovascular:     Rate and Rhythm: Normal rate and regular rhythm.  Pulmonary:     Effort: Pulmonary effort is normal.     Breath sounds: Normal breath sounds. No wheezing or rhonchi.  Musculoskeletal:     Cervical back: Normal range of motion and neck supple. Tenderness present.  Neurological:     Mental Status: She is alert.     UC Treatments / Results  Labs (all labs ordered are listed, but only abnormal results are displayed) Labs Reviewed  SARS CORONAVIRUS 2 (TAT 6-24 HRS)  POC INFLUENZA A AND B ANTIGEN (URGENT CARE ONLY)    EKG   Radiology No results found.  Procedures Procedures (including critical care time)  Medications Ordered in UC Medications - No data to  display  Initial Impression / Assessment and Plan / UC Course  I have reviewed the triage vital signs and the nursing notes.  Pertinent labs & imaging results that were available during my care of the patient were reviewed by me and considered in my medical decision making (see chart for details).   Patient was seen for URI symptoms.  Her flu swab was negative, covid pending.  Will treat with supportive  care at this time.  If covid positive may explore medication for this (she had an infusion the last time she was covid +).  Will monitor.  Please return if you have new/worsening symptoms over time.   Final Clinical Impressions(s) / UC Diagnoses   Final diagnoses:  Viral upper respiratory illness  Subacute cough     Discharge Instructions      You were seen today for upper respiratory symptoms.  You flu swab was negative today.  You covid swab is pending and will be resulted tomorrow.  This seems viral in nature.  For now, I recommend over the counter medication such as clartin or zyrtec to help with itchy eyes and nasal congestion/drainage.  You may use over the counter cough syrup to help with your cough, such as delsym or mucinex.  Please use tylenol for pain and fever.  Please get plenty of rest and fluids.     ED Prescriptions   None    PDMP not reviewed this encounter.   Jannifer Franklin, MD 11/13/21 1524

## 2021-11-13 NOTE — ED Triage Notes (Signed)
Pt presents with bilateral eye irritation, nasal drainage, headache, and sore throat X 2 days.

## 2021-11-14 LAB — SARS CORONAVIRUS 2 (TAT 6-24 HRS): SARS Coronavirus 2: NEGATIVE

## 2022-01-02 ENCOUNTER — Ambulatory Visit (HOSPITAL_COMMUNITY)
Admission: RE | Admit: 2022-01-02 | Discharge: 2022-01-02 | Disposition: A | Discharge status: Home / Self Care | Payer: Self-pay | Source: Ambulatory Visit | Attending: Nurse Practitioner | Admitting: Nurse Practitioner

## 2022-01-02 ENCOUNTER — Other Ambulatory Visit: Payer: Self-pay

## 2022-01-02 ENCOUNTER — Encounter (HOSPITAL_COMMUNITY): Payer: Self-pay

## 2022-01-02 VITALS — BP 116/51 | HR 107 | Temp 98.0°F | Resp 20

## 2022-01-02 DIAGNOSIS — J069 Acute upper respiratory infection, unspecified: Secondary | ICD-10-CM

## 2022-01-02 DIAGNOSIS — J4521 Mild intermittent asthma with (acute) exacerbation: Secondary | ICD-10-CM

## 2022-01-02 MED ORDER — PREDNISONE 20 MG PO TABS: 40.0000 mg | ORAL_TABLET | Freq: Every day | ORAL | 0 refills | Status: AC

## 2022-01-02 MED ORDER — PROMETHAZINE-DM 6.25-15 MG/5ML PO SYRP: 5.0000 mL | ORAL_SOLUTION | Freq: Four times a day (QID) | ORAL | 0 refills | Status: DC | PRN

## 2022-01-02 MED ORDER — BENZONATATE 100 MG PO CAPS: 100.0000 mg | ORAL_CAPSULE | Freq: Two times a day (BID) | ORAL | 0 refills | Status: DC | PRN

## 2022-01-02 NOTE — Discharge Instructions (Addendum)
Please continue plenty of hydration and over-the-counter medications to help with your symptoms ?-You can use the Tessalon Perles and cough syrup at nighttime to help with your cough.  Do not take them at the same time. ?-Please start the prednisone tomorrow to help with your wheezing and asthma ?

## 2022-01-02 NOTE — ED Provider Notes (Signed)
?[facility] ? ? ? ?CSN: [id number] ?Arrival date & time: 01/02/22  1407 ? ? ?  ? ?History   ?Chief Complaint ?Chief Complaint  ?Patient presents with  ? Cough  ?  Fever sore throat cough headache - Entered by patient  ? Appointment  ? ? ?HPI ?[name] is a 56 y.o. female.  ? ?Patient reports 2-day history of fevers, cough, congestion, shortness of breath, wheezing.  She has been using albuterol inhaler with minimal relief of wheezing.  She reports she thinks she caught swelling from her son.  She also endorses postnasal drip, occasional headache.  She denies abdominal pain, nausea, vomiting.  She reports every time she coughs, there is a sharp pain under her left breast.  She has been taking TheraFlu, Mucinex, NyQuil, DayQuil with minimal relief of symptoms. ? ? ?Past Medical History:  ?Diagnosis Date  ? Asthma   ? GERD (gastroesophageal reflux disease)   ? ? ?Patient Active Problem List  ? Diagnosis Date Noted  ? HIV positive (HCC) 12/13/2019  ? Asthma, chronic 12/13/2019  ? Environmental allergies 12/13/2019  ? Spondylolisthesis at L5-S1 level 12/13/2019  ? ? ?Past Surgical History:  ?Procedure Laterality Date  ? ABDOMINAL HYSTERECTOMY    ? ? ?OB History   ?No obstetric history on file. ?  ? ? ? ?Home Medications   ? ?Prior to Admission medications   ?Medication Sig Start Date End Date Taking? Authorizing Provider  ?benzonatate (TESSALON) 100 MG capsule Take 1 capsule (100 mg total) by mouth 2 (two) times daily as needed for cough. Do not take while driving or operating heavy machinery 01/02/22  Yes Valentino NoseMartinez, Sheron Robin A, NP  ?predniSONE (DELTASONE) 20 MG tablet Take 2 tablets (40 mg total) by mouth daily with breakfast for 5 days. 01/02/22 01/07/22 Yes Valentino NoseMartinez, Rayshawn Visconti A, NP  ?promethazine-dextromethorphan (PROMETHAZINE-DM) 6.25-15 MG/5ML syrup Take 5 mLs by mouth 4 (four) times daily as needed for cough. Do not take while driving or operating heavy machinery 01/02/22  Yes Valentino NoseMartinez, Elias Bordner A,  NP  ?abacavir-dolutegravir-lamiVUDine (TRIUMEQ) 600-50-300 MG tablet Take by mouth. 11/29/16   [provider]  ?albuterol (PROVENTIL) (2.5 MG/3ML) 0.083% nebulizer solution Take 3 mLs (2.5 mg total) by nebulization every 4 (four) hours as needed. For shortness of breath 10/17/20   Wallis BambergMani, Mario, PA-C  ?albuterol (VENTOLIN HFA) 108 (90 Base) MCG/ACT inhaler Inhale 1-2 puffs into the lungs every 6 (six) hours as needed for wheezing or shortness of breath. 10/17/20   Wallis BambergMani, Mario, PA-C  ?cetirizine (ZYRTEC ALLERGY) 10 MG tablet Take 1 tablet (10 mg total) by mouth daily. 10/17/20   Wallis BambergMani, Mario, PA-C  ?cyclobenzaprine (FLEXERIL) 10 MG tablet Take 1 tablet (10 mg total) by mouth 3 (three) times daily as needed for muscle spasms. Not drink alcohol or drive while taking this medication.  May cause drowsiness. 08/24/21   Particia NearingLane, Rachel Elizabeth, PA-C  ?fluticasone (FLOVENT HFA) 110 MCG/ACT inhaler Inhale 2 puffs into the lungs 2 (two) times daily. 07/22/21   Particia NearingLane, Rachel Elizabeth, PA-C  ?Fluticasone-Salmeterol (ADVAIR) 250-50 MCG/DOSE AEPB Inhale 1 puff into the lungs every 12 (twelve) hours. 08/11/19   Domenick GongMortenson, Ashley, MD  ?montelukast (SINGULAIR) 10 MG tablet Take 1 tablet (10 mg total) by mouth at bedtime. 05/06/20   Wallis BambergMani, Mario, PA-C  ?naproxen (NAPROSYN) 500 MG tablet Take 1 tablet (500 mg total) by mouth 2 (two) times daily as needed. 08/24/21   Particia NearingLane, Rachel Elizabeth, PA-C  ?pseudoephedrine (SUDAFED) 60 MG tablet Take 1 tablet (  60 mg total) by mouth every 8 (eight) hours as needed for congestion. 10/17/20   Wallis Bamberg, PA-C  ?Spacer/Aero-Holding Chambers (AEROCHAMBER PLUS) inhaler Use as instructed ?Patient not taking: No sig reported 08/11/19   Domenick Gong, MD  ? ? ?Family History ?History reviewed. No pertinent family history. ? ?Social History ?Social History  ? ?Tobacco Use  ? Smoking status: Never  ? Smokeless tobacco: Never  ?Substance Use Topics  ? Alcohol use: No  ? Drug use: No  ? ? ? ?Allergies    ?Patient has no known allergies. ? ? ?Review of Systems ?Review of Systems ?Per HPI ? ?Physical Exam ?Triage Vital Signs ?ED Triage Vitals  ?Enc Vitals Group  ?   BP 01/02/22 1500 (!) 116/51  ?   Pulse Rate 01/02/22 1500 (!) 107  ?   Resp 01/02/22 1500 20  ?   Temp 01/02/22 1500 98 ?F (36.7 ?C)  ?   Temp src --   ?   SpO2 01/02/22 1500 98 %  ?   Weight --   ?   Height --   ?   Head Circumference --   ?   Peak Flow --   ?   Pain Score 01/02/22 1459 10  ?   Pain Loc --   ?   Pain Edu? --   ?   Excl. in GC? --   ? ?No data found. ? ?Updated Vital Signs ?BP (!) 116/51   Pulse (!) 107   Temp 98 ?F (36.7 ?C)   Resp 20   SpO2 98%  ? ?Visual Acuity ?Right Eye Distance:   ?Left Eye Distance:   ?Bilateral Distance:   ? ?Right Eye Near:   ?Left Eye Near:    ?Bilateral Near:    ? ?Physical Exam ?Vitals and nursing note reviewed.  ?Constitutional:   ?   General: She is not in acute distress. ?   Appearance: Normal appearance. She is not ill-appearing or toxic-appearing.  ?HENT:  ?   Head: Normocephalic and atraumatic.  ?   Nose: Congestion present. No rhinorrhea.  ?   Mouth/Throat:  ?   Mouth: Mucous membranes are moist.  ?   Pharynx: Oropharynx is clear. Posterior oropharyngeal erythema present. No oropharyngeal exudate.  ?Cardiovascular:  ?   Rate and Rhythm: Normal rate and regular rhythm.  ?Pulmonary:  ?   Effort: Pulmonary effort is normal. No respiratory distress.  ?   Breath sounds: Normal breath sounds. No wheezing, rhonchi or rales.  ?Musculoskeletal:  ?   Cervical back: Normal range of motion and neck supple.  ?Lymphadenopathy:  ?   Cervical: No cervical adenopathy.  ?Skin: ?   General: Skin is warm and dry.  ?   Coloration: Skin is not jaundiced or pale.  ?   Findings: No erythema or rash.  ?Neurological:  ?   Mental Status: She is alert and oriented to person, place, and time.  ?Psychiatric:     ?   Mood and Affect: Mood normal.     ?   Behavior: Behavior normal.  ? ? ? ?UC Treatments / Results  ?Labs ?(all labs  ordered are listed, but only abnormal results are displayed) ?Labs Reviewed - No data to display ? ?EKG ? ? ?Radiology ?No results found. ? ?Procedures ?Procedures (including critical care time) ? ?Medications Ordered in UC ?Medications - No data to display ? ?Initial Impression / Assessment and Plan / UC Course  ?I have reviewed the triage vital  signs and the nursing notes. ? ?Pertinent labs & imaging results that were available during my care of the patient were reviewed by me and considered in my medical decision making (see chart for details). ? ?  ?Discussed with patient that her symptoms are consistent with a viral upper respiratory tract infection.  Continue over-the-counter medications-symptoms should be self-limiting over the next week or so.  We also discussed cough medication including Tessalon Perles and promethazine-dextromethorphan.  She can take both of these at nighttime, but not together, for her cough.  She is not wheezing on examination, however given history of asthma and reported wheezing, will start prednisone 40 mg daily for 5 days for asthma exacerbation.  Follow-up if symptoms worsen or persist. ?Final Clinical Impressions(s) / UC Diagnoses  ? ?Final diagnoses:  ?Viral URI with cough  ?Mild intermittent asthma with exacerbation  ? ? ? ?Discharge Instructions   ? ?  ? Please continue plenty of hydration and over-the-counter medications to help with your symptoms ?-You can use the Tessalon Perles and cough syrup at nighttime to help with your cough.  Do not take them at the same time. ?-Please start the prednisone tomorrow to help with your wheezing and asthma ? ? ? ? ?ED Prescriptions   ? ? Medication Sig Dispense Auth. Provider  ? predniSONE (DELTASONE) 20 MG tablet Take 2 tablets (40 mg total) by mouth daily with breakfast for 5 days. 10 tablet Cathlean Marseilles A, NP  ? benzonatate (TESSALON) 100 MG capsule Take 1 capsule (100 mg total) by mouth 2 (two) times daily as needed for cough. Do  not take while driving or operating heavy machinery 20 capsule Cathlean Marseilles A, NP  ? promethazine-dextromethorphan (PROMETHAZINE-DM) 6.25-15 MG/5ML syrup Take 5 mLs by mouth 4 (four) times daily as neede

## 2022-01-02 NOTE — ED Triage Notes (Addendum)
Pt presents with complaints of cough. Reports pain in ribs with cough. X 2 days. Pt also endorses scratchy throat, emesis, and headache.  ?

## 2022-06-18 ENCOUNTER — Ambulatory Visit (HOSPITAL_COMMUNITY)
Admission: RE | Admit: 2022-06-18 | Discharge: 2022-06-18 | Disposition: A | Discharge status: Home / Self Care | Payer: Self-pay | Source: Ambulatory Visit | Attending: Family Medicine | Admitting: Family Medicine

## 2022-06-18 ENCOUNTER — Encounter (HOSPITAL_COMMUNITY): Payer: Self-pay

## 2022-06-18 ENCOUNTER — Ambulatory Visit (INDEPENDENT_AMBULATORY_CARE_PROVIDER_SITE_OTHER): Payer: Self-pay

## 2022-06-18 VITALS — BP 110/60 | HR 87 | Temp 98.4°F | Resp 17

## 2022-06-18 DIAGNOSIS — M25511 Pain in right shoulder: Secondary | ICD-10-CM

## 2022-06-18 DIAGNOSIS — Z20822 Contact with and (suspected) exposure to covid-19: Secondary | ICD-10-CM | POA: Insufficient documentation

## 2022-06-18 DIAGNOSIS — J4541 Moderate persistent asthma with (acute) exacerbation: Secondary | ICD-10-CM

## 2022-06-18 DIAGNOSIS — R059 Cough, unspecified: Secondary | ICD-10-CM

## 2022-06-18 DIAGNOSIS — J069 Acute upper respiratory infection, unspecified: Secondary | ICD-10-CM

## 2022-06-18 DIAGNOSIS — R062 Wheezing: Secondary | ICD-10-CM

## 2022-06-18 MED ORDER — ALBUTEROL SULFATE HFA 108 (90 BASE) MCG/ACT IN AERS: 2.0000 | INHALATION_SPRAY | RESPIRATORY_TRACT | 0 refills | Status: AC | PRN

## 2022-06-18 MED ORDER — KETOROLAC TROMETHAMINE 30 MG/ML IJ SOLN: 30.0000 mg | Freq: Once | INTRAMUSCULAR | Status: DC

## 2022-06-18 MED ORDER — BENZONATATE 100 MG PO CAPS: 100.0000 mg | ORAL_CAPSULE | Freq: Two times a day (BID) | ORAL | 0 refills | Status: DC | PRN

## 2022-06-18 MED ORDER — PREDNISONE 20 MG PO TABS: 40.0000 mg | ORAL_TABLET | Freq: Every day | ORAL | 0 refills | Status: AC

## 2022-06-18 MED ORDER — TIZANIDINE HCL 4 MG PO TABS: 4.0000 mg | ORAL_TABLET | Freq: Three times a day (TID) | ORAL | 0 refills | Status: DC | PRN
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
## 2022-06-18 NOTE — Discharge Instructions (Addendum)
Your chest x-ray showed signs of bronchospasm that you are experiencing.  There is no pneumonia noted on it  There is no fracture of the shoulder   You have been swabbed for COVID, and the test will result in the next 24 hours. Our staff will call you if positive. If the test is positive, you should quarantine for 5 days from the start of your symptoms  Albuterol inhaler--do 2 puffs every 4 hours as needed for shortness of breath or wheezing  Take benzonatate 100 mg, 1 tab every 8 hours as needed for cough.  Take prednisone 20 mg--2 daily for 5 days
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
## 2022-06-19 LAB — SARS CORONAVIRUS 2 (TAT 6-24 HRS): SARS Coronavirus 2: NEGATIVE

## 2022-07-06 ENCOUNTER — Ambulatory Visit (HOSPITAL_COMMUNITY)
Admission: EM | Admit: 2022-07-06 | Discharge: 2022-07-06 | Disposition: A | Discharge status: Home / Self Care | Payer: Self-pay | Attending: Internal Medicine | Admitting: Internal Medicine

## 2022-07-06 ENCOUNTER — Encounter (HOSPITAL_COMMUNITY): Payer: Self-pay

## 2022-07-06 DIAGNOSIS — R0981 Nasal congestion: Secondary | ICD-10-CM

## 2022-07-06 DIAGNOSIS — Z28310 Unvaccinated for covid-19: Secondary | ICD-10-CM | POA: Insufficient documentation

## 2022-07-06 DIAGNOSIS — H109 Unspecified conjunctivitis: Secondary | ICD-10-CM

## 2022-07-06 DIAGNOSIS — J069 Acute upper respiratory infection, unspecified: Secondary | ICD-10-CM

## 2022-07-06 DIAGNOSIS — H1089 Other conjunctivitis: Secondary | ICD-10-CM | POA: Insufficient documentation

## 2022-07-06 DIAGNOSIS — J029 Acute pharyngitis, unspecified: Secondary | ICD-10-CM

## 2022-07-06 DIAGNOSIS — Z21 Asymptomatic human immunodeficiency virus [HIV] infection status: Secondary | ICD-10-CM | POA: Insufficient documentation

## 2022-07-06 DIAGNOSIS — J45909 Unspecified asthma, uncomplicated: Secondary | ICD-10-CM | POA: Insufficient documentation

## 2022-07-06 DIAGNOSIS — Z20822 Contact with and (suspected) exposure to covid-19: Secondary | ICD-10-CM | POA: Insufficient documentation

## 2022-07-06 LAB — SARS CORONAVIRUS 2 (TAT 6-24 HRS): SARS Coronavirus 2: NEGATIVE

## 2022-07-06 LAB — POCT RAPID STREP A, ED / UC: Streptococcus, Group A Screen (Direct): NEGATIVE

## 2022-07-06 MED ORDER — PROMETHAZINE-DM 6.25-15 MG/5ML PO SYRP: 5.0000 mL | ORAL_SOLUTION | Freq: Three times a day (TID) | ORAL | 0 refills | Status: DC | PRN

## 2022-07-06 MED ORDER — OFLOXACIN 0.3 % OP SOLN: 1.0000 [drp] | Freq: Four times a day (QID) | OPHTHALMIC | 0 refills | Status: DC

## 2022-07-06 NOTE — ED Provider Notes (Signed)
[place]    CSN: [id number] Arrival date & time: 07/06/22  0801      History   Chief Complaint Chief Complaint  Patient presents with   Covid Exposure    HPI [name] is a 56 y.o. female.   Patient presents today with several concerns.  She reports a 2-day history of URI symptoms including cough, congestion, sore throat.  Denies any fever, chest pain, shortness of breath.  She does have a history of asthma and has been using her albuterol inhaler approximately once a days and symptoms began but reports that symptoms respond readily to this medication.  She has been taking Mucinex DM and other over-the-counter's without improvement.  She reports COVID is going on her place of employment but she denies any specific known sick contacts.  She did have a URI several weeks ago and was tested for COVID at that time she was negative.  She has had COVID in the past.  She has not had COVID vaccines.  In addition to asthma she has a history of HIV but consistently takes antiviral therapy.    In addition, she reports that a few days ago she woke up with matting and irritation of her left eye.  This has since spread to her right eye.  Reports that she has had purulent drainage and when she woke up this morning she had to pry her eyes open.  She does report that her grandson had pinkeye recently and might been exposed there.  She does wear glasses but does not wear contacts.  Denies any exposure to chemicals or fine particulate matter.  Denies any ocular pain, visual disturbance, fever, headache, dizziness.    Past Medical History:  Diagnosis Date   Asthma    GERD (gastroesophageal reflux disease)     Patient Active Problem List   Diagnosis Date Noted   HIV positive ([place]) 12/13/2019   Asthma, chronic 12/13/2019   Environmental allergies 12/13/2019   Spondylolisthesis at L5-S1 level 12/13/2019    Past Surgical History:  Procedure Laterality Date   ABDOMINAL  HYSTERECTOMY      OB History   No obstetric history on file.      Home Medications    Prior to Admission medications   Medication Sig Start Date End Date Taking? Authorizing Provider  ofloxacin (OCUFLOX) 0.3 % ophthalmic solution Place 1 drop into both eyes 4 (four) times daily. 07/06/22  Yes Zahniya Zellars K, PA-C  abacavir-dolutegravir-lamiVUDine (TRIUMEQ) [id number] MG tablet Take by mouth. 11/29/16   [provider]  albuterol (PROVENTIL) (2.5 MG/3ML) 0.083% nebulizer solution Take 3 mLs (2.5 mg total) by nebulization every 4 (four) hours as needed. For shortness of breath 10/17/20   Jaynee Eagles, PA-C  albuterol (VENTOLIN HFA) 108 (90 Base) MCG/ACT inhaler Inhale 2 puffs into the lungs every 4 (four) hours as needed for wheezing or shortness of breath. 06/18/22   Barrett Henle, MD  cetirizine (ZYRTEC ALLERGY) 10 MG tablet Take 1 tablet (10 mg total) by mouth daily. 10/17/20   Jaynee Eagles, PA-C  fluticasone (FLOVENT HFA) 110 MCG/ACT inhaler Inhale 2 puffs into the lungs 2 (two) times daily. 07/22/21   Volney American, PA-C  Fluticasone-Salmeterol (ADVAIR) 250-50 MCG/DOSE AEPB Inhale 1 puff into the lungs every 12 (twelve) hours. 08/11/19   Melynda Ripple, MD  montelukast (SINGULAIR) 10 MG tablet Take 1 tablet (10 mg total) by mouth at bedtime. 05/06/20   Jaynee Eagles, PA-C  naproxen (NAPROSYN) 500 MG tablet Take  1 tablet (500 mg total) by mouth 2 (two) times daily as needed. 08/24/21   Volney American, PA-C  promethazine-dextromethorphan (PROMETHAZINE-DM) 6.25-15 MG/5ML syrup Take 5 mLs by mouth 3 (three) times daily as needed for cough. Do not take while driving or operating heavy machinery 07/06/22   Demetre Monaco, Derry Skill, PA-C  Spacer/Aero-Holding Chambers (AEROCHAMBER PLUS) inhaler Use as instructed Patient not taking: No sig reported 08/11/19   Melynda Ripple, MD  tiZANidine (ZANAFLEX) 4 MG tablet Take 1 tablet (4 mg total) by mouth every 8 (eight) hours as needed for  muscle spasms. 06/18/22   Barrett Henle, MD    Family History History reviewed. No pertinent family history.  Social History Social History   Tobacco Use   Smoking status: Never   Smokeless tobacco: Never  Substance Use Topics   Alcohol use: No   Drug use: No     Allergies   Oxycodone   Review of Systems Review of Systems  Constitutional:  Positive for activity change. Negative for appetite change, fatigue and fever.  HENT:  Positive for congestion and sore throat. Negative for sinus pressure and sneezing.   Eyes:  Positive for discharge, redness and itching. Negative for photophobia, pain and visual disturbance.  Respiratory:  Positive for cough. Negative for shortness of breath.   Cardiovascular:  Negative for chest pain.  Gastrointestinal:  Negative for abdominal pain, diarrhea, nausea and vomiting.  Neurological:  Negative for dizziness, light-headedness and headaches.     Physical Exam Triage Vital Signs ED Triage Vitals  Enc Vitals Group     BP 07/06/22 0816 112/73     Pulse Rate 07/06/22 0816 91     Resp 07/06/22 0816 12     Temp 07/06/22 0816 99 F (37.2 C)     Temp Source 07/06/22 0816 Oral     SpO2 07/06/22 0816 98 %     Weight 07/06/22 0817 199 lb (90.3 kg)     Height 07/06/22 0817 5\' 7"  (1.702 m)     Head Circumference --      Peak Flow --      Pain Score 07/06/22 0817 9     Pain Loc --      Pain Edu? --      Excl. in [place]? --    No data found.  Updated Vital Signs BP 112/73 (BP Location: Right Arm)   Pulse 91   Temp 99 F (37.2 C) (Oral)   Resp 12   Ht 5\' 7"  (1.702 m)   Wt 199 lb (90.3 kg)   SpO2 98%   BMI 31.17 kg/m   Visual Acuity Right Eye Distance:   Left Eye Distance:   Bilateral Distance:    Right Eye Near:   Left Eye Near:    Bilateral Near:     Physical Exam Vitals reviewed.  Constitutional:      General: She is awake. She is not in acute distress.    Appearance: Normal appearance. She is well-developed. She is not  ill-appearing.     Comments: Very pleasant female appears stated age in no acute distress sitting comfortably in exam room  HENT:     Head: Normocephalic and atraumatic.     Right Ear: Tympanic membrane, ear canal and external ear normal. Tympanic membrane is not erythematous or bulging.     Left Ear: Tympanic membrane, ear canal and external ear normal. Tympanic membrane is not erythematous or bulging.     Nose:  Right Sinus: No maxillary sinus tenderness.     Left Sinus: No maxillary sinus tenderness.     Mouth/Throat:     Pharynx: Uvula midline. No oropharyngeal exudate or posterior oropharyngeal erythema.  Eyes:     Extraocular Movements: Extraocular movements intact.     Conjunctiva/sclera:     Right eye: Right conjunctiva is injected.     Left eye: Left conjunctiva is injected.     Pupils: Pupils are equal, round, and reactive to light.     Comments: Significant debris noted bilateral upper and lower lashes with purulent drainage noted in medial canthus.  Cardiovascular:     Rate and Rhythm: Normal rate and regular rhythm.     Heart sounds: Normal heart sounds, S1 normal and S2 normal. No murmur heard. Pulmonary:     Effort: Pulmonary effort is normal.     Breath sounds: Normal breath sounds. No wheezing, rhonchi or rales.     Comments: Clear to auscultation bilaterally Psychiatric:        Behavior: Behavior is cooperative.      UC Treatments / Results  Labs (all labs ordered are listed, but only abnormal results are displayed) Labs Reviewed  SARS CORONAVIRUS 2 (TAT 6-24 HRS)  POCT RAPID STREP A, ED / UC    EKG   Radiology No results found.  Procedures Procedures (including critical care time)  Medications Ordered in UC Medications - No data to display  Initial Impression / Assessment and Plan / UC Course  I have reviewed the triage vital signs and the nursing notes.  Pertinent labs & imaging results that were available during my care of the patient were  reviewed by me and considered in my medical decision making (see chart for details).     I suspect that congestion symptoms are related to either mild URI or allergies.  Patient is well-appearing, afebrile, nontoxic, nontachycardic.  No evidence of acute infection on physical exam that would warrant initiation of antibiotics.  Concern for COVID given known exposure.  COVID testing was obtained today-results pending.  If patient is positive she would benefit from Paxlovid given history of asthma and HIV.  She had a recent metabolic panel in care everywhere from 06/20/2022 with estimated GFR of 59 so would need renal dosing (reduced dose) of Paxlovid.  She does not require any medication changes.  She was provided a prescription for Promethazine DM for cough with instruction not to drive or drink alcohol with taking this as drowsiness is a common side effect.  Can use over-the-counter medications including antihistamines and Flonase for additional symptom relief.  Discussed that if she has any worsening symptoms she needs to be seen immediately.  Strict return precautions given.  Work excuse note with current CDC return to work guidelines based on COVID test result provided.  Will cover for bacterial etiology given significant overnight drainage.  Patient was started on ofloxacin 1 drop in each eye 4 times daily.  Discussed that she should not touch the tip of the medication bottle to her eye and should wash her hands before handling medication to prevent contamination.  Discussed that if her symptoms are not improving or if anything worsens she needs to be seen immediately.  Final Clinical Impressions(s) / UC Diagnoses   Final diagnoses:  Upper respiratory tract infection, unspecified type  Nasal congestion  Sore throat  Bacterial conjunctivitis     Discharge Instructions      Monitor your MyChart for your COVID results.  If you  are positive you would likely benefit from Paxlovid.  Make sure that  you are resting and drinking plenty of fluid.  Use Promethazine DM for cough.  This will make you sleepy so do not drive or drink alcohol taking it.  Continue over-the-counter medications as needed.  If anything worsens you need to be seen immediately.  Use ofloxacin 4 times daily in both eyes.  As we discussed, you should avoid touching tip of medication bottle to your eye and make sure to wash hands prior to handling medication to prevent contamination of the medicine.  Clean your eye with warm compress before applying drops.  If your symptoms do not improving quickly please follow-up with ophthalmology; call to schedule an appointment.  If you have any sudden worsening symptoms including visual disturbance, eye pain, fever you need to go to the emergency room immediately.     ED Prescriptions     Medication Sig Dispense Auth. Provider   promethazine-dextromethorphan (PROMETHAZINE-DM) 6.25-15 MG/5ML syrup Take 5 mLs by mouth 3 (three) times daily as needed for cough. Do not take while driving or operating heavy machinery 118 mL Eathon Valade K, PA-C   ofloxacin (OCUFLOX) 0.3 % ophthalmic solution Place 1 drop into both eyes 4 (four) times daily. 5 mL Sitlaly Gudiel K, PA-C      PDMP not reviewed this encounter.   Terrilee Croak, PA-C 07/06/22 [id number]

## 2022-07-06 NOTE — ED Triage Notes (Signed)
Pt is here for sore throat with painful swallowing  both eyes are red with drainage x2days

## 2022-07-06 NOTE — Discharge Instructions (Signed)
Monitor your MyChart for your COVID results.  If you are positive you would likely benefit from Paxlovid.  Make sure that you are resting and drinking plenty of fluid.  Use Promethazine DM for cough.  This will make you sleepy so do not drive or drink alcohol taking it.  Continue over-the-counter medications as needed.  If anything worsens you need to be seen immediately.  Use ofloxacin 4 times daily in both eyes.  As we discussed, you should avoid touching tip of medication bottle to your eye and make sure to wash hands prior to handling medication to prevent contamination of the medicine.  Clean your eye with warm compress before applying drops.  If your symptoms do not improving quickly please follow-up with ophthalmology; call to schedule an appointment.  If you have any sudden worsening symptoms including visual disturbance, eye pain, fever you need to go to the emergency room immediately.

## 2022-09-10 ENCOUNTER — Ambulatory Visit (HOSPITAL_COMMUNITY)
Admission: RE | Admit: 2022-09-10 | Discharge: 2022-09-10 | Disposition: A | Discharge status: Home / Self Care | Payer: No Typology Code available for payment source | Source: Ambulatory Visit | Attending: Physician Assistant | Admitting: Physician Assistant

## 2022-09-10 ENCOUNTER — Encounter (HOSPITAL_COMMUNITY): Payer: Self-pay

## 2022-09-10 ENCOUNTER — Ambulatory Visit (INDEPENDENT_AMBULATORY_CARE_PROVIDER_SITE_OTHER): Payer: No Typology Code available for payment source

## 2022-09-10 VITALS — BP 121/81 | HR 76 | Temp 98.1°F | Resp 12

## 2022-09-10 DIAGNOSIS — S4991XD Unspecified injury of right shoulder and upper arm, subsequent encounter: Secondary | ICD-10-CM

## 2022-09-10 DIAGNOSIS — S43101D Unspecified dislocation of right acromioclavicular joint, subsequent encounter: Secondary | ICD-10-CM | POA: Diagnosis not present

## 2022-09-10 DIAGNOSIS — G8929 Other chronic pain: Secondary | ICD-10-CM | POA: Diagnosis not present

## 2022-09-10 DIAGNOSIS — M25511 Pain in right shoulder: Secondary | ICD-10-CM

## 2022-09-10 MED ORDER — TIZANIDINE HCL 4 MG PO TABS: 4.0000 mg | ORAL_TABLET | Freq: Three times a day (TID) | ORAL | 0 refills | Status: DC | PRN

## 2022-09-10 MED ORDER — NAPROXEN 500 MG PO TABS: 500.0000 mg | ORAL_TABLET | Freq: Two times a day (BID) | ORAL | 0 refills | Status: DC | PRN
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
## 2022-09-10 NOTE — Discharge Instructions (Signed)
Your x-ray was stable compared to the last one obtained in September.  There was no fracture but there is obvious injury to the area where you are collarbone meets your shoulder.  Use ice and elevation.  Avoid strenuous activity.  It is very important that you follow-up with orthopedics.  Please call them to schedule an appointment.  Take Naprosyn for pain relief.  Do not take additional NSAIDs with this medication including aspirin, ibuprofen/Advil, naproxen/Aleve.  You can use tizanidine up to 3 times a day for additional pain relief.  This will make you sleepy do not drive or drink alcohol with taking it.  If you have any worsening symptoms you need to be seen immediately.

## 2022-09-10 NOTE — ED Triage Notes (Signed)
Pt injured right shoulder in August , pt thinks she may have strained it more due to working and sweeping. X 3days

## 2022-11-15 ENCOUNTER — Encounter (HOSPITAL_COMMUNITY): Payer: Self-pay

## 2022-11-15 ENCOUNTER — Ambulatory Visit (HOSPITAL_COMMUNITY)
Admission: RE | Admit: 2022-11-15 | Discharge: 2022-11-15 | Disposition: A | Discharge status: Home / Self Care | Payer: No Typology Code available for payment source | Source: Ambulatory Visit | Attending: Family Medicine | Admitting: Family Medicine

## 2022-11-15 VITALS — BP 140/82 | HR 89 | Temp 98.7°F | Resp 18

## 2022-11-15 DIAGNOSIS — J069 Acute upper respiratory infection, unspecified: Secondary | ICD-10-CM

## 2022-11-15 DIAGNOSIS — Z1152 Encounter for screening for COVID-19: Secondary | ICD-10-CM | POA: Insufficient documentation

## 2022-11-15 LAB — POC INFLUENZA A AND B ANTIGEN (URGENT CARE ONLY)
Influenza A Ag: NEGATIVE
Influenza B Ag: NEGATIVE

## 2022-11-15 MED ORDER — ALBUTEROL SULFATE (2.5 MG/3ML) 0.083% IN NEBU
2.5000 mg | INHALATION_SOLUTION | Freq: Once | RESPIRATORY_TRACT | Status: AC
  Administered 2022-11-15: 2.5 mg via RESPIRATORY_TRACT

## 2022-11-15 MED ORDER — BENZONATATE 200 MG PO CAPS: 200.0000 mg | ORAL_CAPSULE | Freq: Three times a day (TID) | ORAL | 0 refills | Status: DC

## 2022-11-15 MED ORDER — PREDNISONE 20 MG PO TABS: 40.0000 mg | ORAL_TABLET | Freq: Every day | ORAL | 0 refills | Status: AC

## 2022-11-15 MED ORDER — ALBUTEROL SULFATE (2.5 MG/3ML) 0.083% IN NEBU: 2.5000 mg | INHALATION_SOLUTION | RESPIRATORY_TRACT | 0 refills | Status: AC | PRN

## 2022-11-15 MED ORDER — ALBUTEROL SULFATE (2.5 MG/3ML) 0.083% IN NEBU
INHALATION_SOLUTION | RESPIRATORY_TRACT | Status: AC
  Filled 2022-11-15: qty 3

## 2022-11-15 NOTE — ED Triage Notes (Signed)
Patient with c/o sore throat, cough and congestion since yesterday.

## 2022-11-15 NOTE — ED Notes (Signed)
Covid swab placed in lab

## 2022-11-15 NOTE — Discharge Instructions (Addendum)
You were seen today for upper respiratory symptoms.  Your flu swab was negative.  We have swabbed you for covid and this will be resulted by tomorrow.  If positive we could treat you with an antiviral.  In the mean time I have sent out prednisone to help with cough, wheezing and shortness of breath.  You may take tylenol for pain and fever as well.  Please return if not improving or having worsening breathing issues.

## 2022-11-15 NOTE — ED Provider Notes (Signed)
[place]    CSN: [id number] Arrival date & time: 11/15/22  6712      History   Chief Complaint Chief Complaint  Patient presents with   Cough    Fever  headache running nose Constant sneezing and coughing - Entered by patient    HPI [name] is a 57 y.o. female.   Patient is here for sore throat, cough, and sinus congestion since yesterday.  Also sneezing.   She had a fever since yesterday of 102.  + nausea, vomiting.  + body aches.  She does have asthma.  She used her nebulizer this morning.  Some wheezing.  She works at a rehab facility, + sick contacts.  She took motrin and tylenol.  No other meds taken.        Past Medical History:  Diagnosis Date   Asthma    GERD (gastroesophageal reflux disease)     Patient Active Problem List   Diagnosis Date Noted   HIV positive ([place]) 12/13/2019   Asthma, chronic 12/13/2019   Environmental allergies 12/13/2019   Spondylolisthesis at L5-S1 level 12/13/2019    Past Surgical History:  Procedure Laterality Date   ABDOMINAL HYSTERECTOMY      OB History   No obstetric history on file.      Home Medications    Prior to Admission medications   Medication Sig Start Date End Date Taking? Authorizing Provider  abacavir-dolutegravir-lamiVUDine (TRIUMEQ) 458-09-983 MG tablet Take by mouth. 11/29/16   [provider]  albuterol (PROVENTIL) (2.5 MG/3ML) 0.083% nebulizer solution Take 3 mLs (2.5 mg total) by nebulization every 4 (four) hours as needed. For shortness of breath 10/17/20   Jaynee Eagles, PA-C  albuterol (VENTOLIN HFA) 108 (90 Base) MCG/ACT inhaler Inhale 2 puffs into the lungs every 4 (four) hours as needed for wheezing or shortness of breath. 06/18/22   Barrett Henle, MD  cetirizine (ZYRTEC ALLERGY) 10 MG tablet Take 1 tablet (10 mg total) by mouth daily. 10/17/20   Jaynee Eagles, PA-C  fluticasone (FLOVENT HFA) 110 MCG/ACT inhaler Inhale 2 puffs into the lungs 2 (two) times daily.  07/22/21   Volney American, PA-C  Fluticasone-Salmeterol (ADVAIR) 250-50 MCG/DOSE AEPB Inhale 1 puff into the lungs every 12 (twelve) hours. 08/11/19   Melynda Ripple, MD  montelukast (SINGULAIR) 10 MG tablet Take 1 tablet (10 mg total) by mouth at bedtime. 05/06/20   Jaynee Eagles, PA-C  naproxen (NAPROSYN) 500 MG tablet Take 1 tablet (500 mg total) by mouth 2 (two) times daily as needed. 09/10/22   Raspet, Derry Skill, PA-C  ofloxacin (OCUFLOX) 0.3 % ophthalmic solution Place 1 drop into both eyes 4 (four) times daily. 07/06/22   Raspet, Derry Skill, PA-C  promethazine-dextromethorphan (PROMETHAZINE-DM) 6.25-15 MG/5ML syrup Take 5 mLs by mouth 3 (three) times daily as needed for cough. Do not take while driving or operating heavy machinery 07/06/22   Raspet, Derry Skill, PA-C  Spacer/Aero-Holding Chambers (AEROCHAMBER PLUS) inhaler Use as instructed Patient not taking: No sig reported 08/11/19   Melynda Ripple, MD  tiZANidine (ZANAFLEX) 4 MG tablet Take 1 tablet (4 mg total) by mouth every 8 (eight) hours as needed for muscle spasms. 09/10/22   Raspet, Derry Skill, PA-C    Family History No family history on file.  Social History Social History   Tobacco Use   Smoking status: Never   Smokeless tobacco: Never  Substance Use Topics   Alcohol use: No   Drug use: No  Allergies   Oxycodone   Review of Systems Review of Systems  Constitutional:  Positive for chills, fatigue and fever.  HENT:  Positive for congestion, rhinorrhea and sore throat.   Respiratory:  Positive for cough, shortness of breath and wheezing.   Gastrointestinal:  Positive for nausea. Negative for diarrhea.  Genitourinary: Negative.   Musculoskeletal: Negative.   Psychiatric/Behavioral: Negative.       Physical Exam Triage Vital Signs ED Triage Vitals  Enc Vitals Group     BP 11/15/22 0917 (!) 140/82     Pulse Rate 11/15/22 0917 89     Resp 11/15/22 0917 18     Temp 11/15/22 0917 98.7 F (37.1 C)     Temp  Source 11/15/22 0917 Oral     SpO2 11/15/22 0917 97 %     Weight --      Height --      Head Circumference --      Peak Flow --      Pain Score 11/15/22 0918 10     Pain Loc --      Pain Edu? --      Excl. in [place]? --    No data found.  Updated Vital Signs BP (!) 140/82 (BP Location: Right Arm)   Pulse 89   Temp 98.7 F (37.1 C) (Oral)   Resp 18   SpO2 97%   Visual Acuity Right Eye Distance:   Left Eye Distance:   Bilateral Distance:    Right Eye Near:   Left Eye Near:    Bilateral Near:     Physical Exam Constitutional:      Appearance: Normal appearance.  HENT:     Nose: Congestion and rhinorrhea present.     Right Sinus: Maxillary sinus tenderness and frontal sinus tenderness present.     Left Sinus: Maxillary sinus tenderness and frontal sinus tenderness present.     Mouth/Throat:     Mouth: Mucous membranes are moist.     Pharynx: No oropharyngeal exudate or posterior oropharyngeal erythema.  Cardiovascular:     Rate and Rhythm: Normal rate and regular rhythm.  Pulmonary:     Effort: Pulmonary effort is normal.     Breath sounds: Wheezing present.  Musculoskeletal:     Cervical back: Normal range of motion and neck supple. Tenderness present.  Skin:    General: Skin is warm.  Neurological:     General: No focal deficit present.     Mental Status: She is alert.  Psychiatric:        Mood and Affect: Mood normal.      UC Treatments / Results  Labs (all labs ordered are listed, but only abnormal results are displayed) Labs Reviewed  SARS CORONAVIRUS 2 (TAT 6-24 HRS)  POC INFLUENZA A AND B ANTIGEN (URGENT CARE ONLY)    EKG   Radiology No results found.  Procedures Procedures (including critical care time)  Medications Ordered in UC Medications  albuterol (PROVENTIL) (2.5 MG/3ML) 0.083% nebulizer solution 2.5 mg (2.5 mg Nebulization Given 11/15/22 0930)    Initial Impression / Assessment and Plan / UC Course  I have reviewed the triage vital  signs and the nursing notes.  Pertinent labs & imaging results that were available during my care of the patient were reviewed by me and considered in my medical decision making (see chart for details).  Patient seen for URI symptoms.  Flu negative, covid pending.  Declines tessalon perles.  If positive could give paxlovid.  Final Clinical Impressions(s) / UC Diagnoses   Final diagnoses:  Viral URI with cough  Encounter for screening for COVID-19     Discharge Instructions      You were seen today for upper respiratory symptoms.  Your flu swab was negative.  We have swabbed you for covid and this will be resulted by tomorrow.  If positive we could treat you with an antiviral.  In the mean time I have sent out prednisone to help with cough, wheezing and shortness of breath.  You may take tylenol for pain and fever as well.  Please return if not improving or having worsening breathing issues.     ED Prescriptions     Medication Sig Dispense Auth. Provider   predniSONE (DELTASONE) 20 MG tablet Take 2 tablets (40 mg total) by mouth daily for 5 days. 10 tablet Beanca Kiester, MD   benzonatate (TESSALON) 200 MG capsule  (Status: Discontinued) Take 1 capsule (200 mg total) by mouth every 8 (eight) hours for 7 days. 21 capsule Rondel Oh, MD      PDMP not reviewed this encounter.   Rondel Oh, MD 11/15/22 1002

## 2022-11-16 LAB — SARS CORONAVIRUS 2 (TAT 6-24 HRS): SARS Coronavirus 2: NEGATIVE

## 2022-12-13 ENCOUNTER — Ambulatory Visit (HOSPITAL_COMMUNITY)
Admission: RE | Admit: 2022-12-13 | Discharge: 2022-12-13 | Disposition: A | Discharge status: Home / Self Care | Payer: No Typology Code available for payment source | Source: Ambulatory Visit | Attending: Family Medicine | Admitting: Family Medicine

## 2022-12-13 ENCOUNTER — Encounter (HOSPITAL_COMMUNITY): Payer: Self-pay

## 2022-12-13 VITALS — BP 136/82 | HR 91 | Temp 98.4°F | Resp 17

## 2022-12-13 DIAGNOSIS — U071 COVID-19: Secondary | ICD-10-CM

## 2022-12-13 MED ORDER — PROMETHAZINE-DM 6.25-15 MG/5ML PO SYRP: 5.0000 mL | ORAL_SOLUTION | Freq: Three times a day (TID) | ORAL | 0 refills | Status: DC | PRN

## 2022-12-13 MED ORDER — PAXLOVID (300/100) 20 X 150 MG & 10 X 100MG PO TBPK: 3.0000 | ORAL_TABLET | Freq: Two times a day (BID) | ORAL | 0 refills | Status: AC

## 2022-12-13 MED ORDER — PREDNISONE 20 MG PO TABS: 40.0000 mg | ORAL_TABLET | Freq: Every day | ORAL | 0 refills | Status: AC

## 2022-12-13 NOTE — Discharge Instructions (Addendum)
You were seen today for covid 19 infection.  I have sent out a cough syrup and paxlovid today.  Please use your albuterol as needed for cough and wheezing.  Hold off on taking your advair at this time.  I have therefor sent out prednisone for several days instead.  Please get plenty of rest and fluids.  Stay out of work for 5 days from symptoms onset.

## 2022-12-13 NOTE — ED Triage Notes (Signed)
Pt tested positive for covid on Tuesday. Pt endorses cough nausea and headache. Pts symptoms started on Tuesday.

## 2022-12-13 NOTE — ED Provider Notes (Signed)
[place]    CSN: [id number] Arrival date & time: 12/13/22  1510      History   Chief Complaint Chief Complaint  Patient presents with   Fever    Tested positive for Covid having bad headache throwing up sore throat - Entered by patient   Covid Positive    HPI [name] is a 57 y.o. female.   Patient is here for URI symptoms/covid.  She started with symptoms 2 days, headache, nausea,fever, sore throat, cough.  Tested positive for covid that day.  She did have exposure to someone with covid while at work.  She is taking tylenol/motrin for fever, cough syrup mucinex.  She does have asthma.  Slight wheezing that is improved with inhalers.  No sob noted.        Past Medical History:  Diagnosis Date   Asthma    GERD (gastroesophageal reflux disease)     Patient Active Problem List   Diagnosis Date Noted   HIV positive ([place]) 12/13/2019   Asthma, chronic 12/13/2019   Environmental allergies 12/13/2019   Spondylolisthesis at L5-S1 level 12/13/2019    Past Surgical History:  Procedure Laterality Date   ABDOMINAL HYSTERECTOMY      OB History   No obstetric history on file.      Home Medications    Prior to Admission medications   Medication Sig Start Date End Date Taking? Authorizing Provider  abacavir-dolutegravir-lamiVUDine (TRIUMEQ) [id number] MG tablet Take by mouth. 11/29/16   [provider]  albuterol (PROVENTIL) (2.5 MG/3ML) 0.083% nebulizer solution Take 3 mLs (2.5 mg total) by nebulization every 4 (four) hours as needed. For shortness of breath 11/15/22   Jobani Sabado, Junie Panning, MD  albuterol (VENTOLIN HFA) 108 (90 Base) MCG/ACT inhaler Inhale 2 puffs into the lungs every 4 (four) hours as needed for wheezing or shortness of breath. 06/18/22   Barrett Henle, MD  cetirizine (ZYRTEC ALLERGY) 10 MG tablet Take 1 tablet (10 mg total) by mouth daily. 10/17/20   Jaynee Eagles, PA-C  fluticasone (FLOVENT HFA) 110 MCG/ACT inhaler Inhale 2  puffs into the lungs 2 (two) times daily. 07/22/21   Volney American, PA-C  Fluticasone-Salmeterol (ADVAIR) 250-50 MCG/DOSE AEPB Inhale 1 puff into the lungs every 12 (twelve) hours. 08/11/19   Melynda Ripple, MD  montelukast (SINGULAIR) 10 MG tablet Take 1 tablet (10 mg total) by mouth at bedtime. 05/06/20   Jaynee Eagles, PA-C  naproxen (NAPROSYN) 500 MG tablet Take 1 tablet (500 mg total) by mouth 2 (two) times daily as needed. 09/10/22   Raspet, Derry Skill, PA-C  ofloxacin (OCUFLOX) 0.3 % ophthalmic solution Place 1 drop into both eyes 4 (four) times daily. 07/06/22   Raspet, Derry Skill, PA-C  promethazine-dextromethorphan (PROMETHAZINE-DM) 6.25-15 MG/5ML syrup Take 5 mLs by mouth 3 (three) times daily as needed for cough. Do not take while driving or operating heavy machinery 07/06/22   Raspet, Derry Skill, PA-C  Spacer/Aero-Holding Chambers (AEROCHAMBER PLUS) inhaler Use as instructed Patient not taking: No sig reported 08/11/19   Melynda Ripple, MD  tiZANidine (ZANAFLEX) 4 MG tablet Take 1 tablet (4 mg total) by mouth every 8 (eight) hours as needed for muscle spasms. 09/10/22   Raspet, Derry Skill, PA-C    Family History History reviewed. No pertinent family history.  Social History Social History   Tobacco Use   Smoking status: Never   Smokeless tobacco: Never  Substance Use Topics   Alcohol use: No   Drug use: No  Allergies   Oxycodone   Review of Systems Review of Systems  Constitutional:  Positive for fever.  HENT:  Positive for congestion and sore throat.   Respiratory:  Positive for cough. Negative for shortness of breath.   Gastrointestinal:  Positive for nausea.  Musculoskeletal: Negative.   Neurological:  Positive for headaches.  Psychiatric/Behavioral: Negative.       Physical Exam Triage Vital Signs ED Triage Vitals  Enc Vitals Group     BP 12/13/22 1528 136/82     Pulse Rate 12/13/22 1528 91     Resp 12/13/22 1528 17     Temp 12/13/22 1528 98.4 F (36.9  C)     Temp src --      SpO2 12/13/22 1528 96 %     Weight --      Height --      Head Circumference --      Peak Flow --      Pain Score 12/13/22 1526 10     Pain Loc --      Pain Edu? --      Excl. in [place]? --    No data found.  Updated Vital Signs BP 136/82   Pulse 91   Temp 98.4 F (36.9 C)   Resp 17   SpO2 96%   Visual Acuity Right Eye Distance:   Left Eye Distance:   Bilateral Distance:    Right Eye Near:   Left Eye Near:    Bilateral Near:     Physical Exam Constitutional:      Appearance: Normal appearance.  HENT:     Nose: Congestion present.     Mouth/Throat:     Mouth: Mucous membranes are moist.     Pharynx: No oropharyngeal exudate or posterior oropharyngeal erythema.  Cardiovascular:     Rate and Rhythm: Normal rate and regular rhythm.  Pulmonary:     Effort: Pulmonary effort is normal.     Breath sounds: Wheezing present.     Comments: Slight scattered wheezes Musculoskeletal:     Cervical back: Normal range of motion and neck supple. Tenderness present.  Lymphadenopathy:     Cervical: No cervical adenopathy.  Skin:    General: Skin is warm.  Neurological:     General: No focal deficit present.     Mental Status: She is alert.  Psychiatric:        Mood and Affect: Mood normal.      UC Treatments / Results  Labs (all labs ordered are listed, but only abnormal results are displayed) Labs Reviewed - No data to display  EKG   Radiology No results found.  Procedures Procedures (including critical care time)  Medications Ordered in UC Medications - No data to display  Initial Impression / Assessment and Plan / UC Course  I have reviewed the triage vital signs and the nursing notes.  Pertinent labs & imaging results that were available during my care of the patient were reviewed by me and considered in my medical decision making (see chart for details).    Patient was seen for positive home covid test. Today is day #3.  Her  GFR and liver functions are normal.  Reviewed med list today.  She will hold off on advair at this time.  Will instead use oral prednisone and cough syrup.  Follow up if not improving as expected.   Final Clinical Impressions(s) / UC Diagnoses   Final diagnoses:  [id number]     Discharge Instructions  You were seen today for covid 19 infection.  I have sent out a cough syrup and paxlovid today.  Please use your albuterol as needed for cough and wheezing.  Hold off on taking your advair at this time.  I have therefor sent out prednisone for several days instead.  Please get plenty of rest and fluids.  Stay out of work for 5 days from symptoms onset.     ED Prescriptions     Medication Sig Dispense Auth. Provider   promethazine-dextromethorphan (PROMETHAZINE-DM) 6.25-15 MG/5ML syrup Take 5 mLs by mouth 3 (three) times daily as needed for cough. Do not take while driving or operating heavy machinery 118 mL Ceejay Kegley, MD   nirmatrelvir & ritonavir (PAXLOVID, 300/100,) 20 x 150 MG & 10 x '100MG'$  TBPK Take 3 tablets by mouth 2 (two) times daily for 5 days. 30 tablet Christyana Corwin, MD   predniSONE (DELTASONE) 20 MG tablet Take 2 tablets (40 mg total) by mouth daily for 5 days. 10 tablet Rondel Oh, MD      PDMP not reviewed this encounter.   Rondel Oh, MD 12/13/22 [phone]

## 2023-03-25 ENCOUNTER — Ambulatory Visit (HOSPITAL_COMMUNITY)
Admission: RE | Admit: 2023-03-25 | Discharge: 2023-03-25 | Disposition: A | Discharge status: Home / Self Care | Payer: No Typology Code available for payment source | Source: Ambulatory Visit | Attending: Physician Assistant | Admitting: Physician Assistant

## 2023-03-25 ENCOUNTER — Other Ambulatory Visit: Payer: Self-pay

## 2023-03-25 ENCOUNTER — Ambulatory Visit (INDEPENDENT_AMBULATORY_CARE_PROVIDER_SITE_OTHER): Payer: No Typology Code available for payment source

## 2023-03-25 ENCOUNTER — Encounter (HOSPITAL_COMMUNITY): Payer: Self-pay

## 2023-03-25 VITALS — BP 123/74 | HR 86 | Temp 98.0°F | Resp 18

## 2023-03-25 DIAGNOSIS — G8929 Other chronic pain: Secondary | ICD-10-CM | POA: Diagnosis present

## 2023-03-25 DIAGNOSIS — M542 Cervicalgia: Secondary | ICD-10-CM | POA: Insufficient documentation

## 2023-03-25 DIAGNOSIS — M25511 Pain in right shoulder: Secondary | ICD-10-CM | POA: Diagnosis present

## 2023-03-25 LAB — CBC WITH DIFFERENTIAL/PLATELET
Abs Immature Granulocytes: 0.02 10*3/uL (ref 0.00–0.07)
Basophils Absolute: 0.1 10*3/uL (ref 0.0–0.1)
Basophils Relative: 1 %
Eosinophils Absolute: 0.3 10*3/uL (ref 0.0–0.5)
Eosinophils Relative: 4 %
HCT: 37.6 % (ref 36.0–46.0)
Hemoglobin: 12.2 g/dL (ref 12.0–15.0)
Immature Granulocytes: 0 %
Lymphocytes Relative: 39 %
Lymphs Abs: 3.5 10*3/uL (ref 0.7–4.0)
MCH: 29.6 pg (ref 26.0–34.0)
MCHC: 32.4 g/dL (ref 30.0–36.0)
MCV: 91.3 fL (ref 80.0–100.0)
Monocytes Absolute: 0.7 10*3/uL (ref 0.1–1.0)
Monocytes Relative: 8 %
Neutro Abs: 4.3 10*3/uL (ref 1.7–7.7)
Neutrophils Relative %: 48 %
Platelets: 285 10*3/uL (ref 150–400)
RBC: 4.12 MIL/uL (ref 3.87–5.11)
RDW: 13.4 % (ref 11.5–15.5)
WBC: 8.9 10*3/uL (ref 4.0–10.5)
nRBC: 0 % (ref 0.0–0.2)

## 2023-03-25 LAB — BASIC METABOLIC PANEL
Anion gap: 8 (ref 5–15)
BUN: 9 mg/dL (ref 6–20)
CO2: 24 mmol/L (ref 22–32)
Calcium: 9.3 mg/dL (ref 8.9–10.3)
Chloride: 106 mmol/L (ref 98–111)
Creatinine, Ser: 1.09 mg/dL — ABNORMAL HIGH (ref 0.44–1.00)
GFR, Estimated: 59 mL/min — ABNORMAL LOW (ref >60)
Glucose, Bld: 84 mg/dL (ref 70–99)
Potassium: 4.2 mmol/L (ref 3.5–5.1)
Sodium: 138 mmol/L (ref 135–145)

## 2023-03-25 LAB — TSH: TSH: 0.996 u[IU]/mL (ref 0.350–4.500)

## 2023-03-25 LAB — POCT RAPID STREP A (OFFICE): Rapid Strep A Screen: NEGATIVE

## 2023-03-25 LAB — POCT MONO SCREEN (KUC): Mono, POC: NEGATIVE

## 2023-03-25 LAB — T4, FREE: Free T4: 0.9 ng/dL (ref 0.61–1.12)

## 2023-03-25 MED ORDER — METHOCARBAMOL 500 MG PO TABS: 500.0000 mg | ORAL_TABLET | Freq: Two times a day (BID) | ORAL | 0 refills | Status: AC

## 2023-03-25 MED ORDER — IBUPROFEN 800 MG PO TABS: 800.0000 mg | ORAL_TABLET | Freq: Three times a day (TID) | ORAL | 0 refills | Status: AC

## 2023-03-25 NOTE — Discharge Instructions (Signed)
Your x-ray shows a stable separation between your shoulder and your collarbone.  As we discussed, I think you need more advanced imaging.  Please follow-up with orthopedics.  Call them to schedule appointment.  Take ibuprofen 800 mg up to 3 times a day.  Do not take additional NSAIDs with this medication including aspirin, ibuprofen/Advil, naproxen/Aleve.  Take Robaxin up to twice a day.  This will make you sleepy so do not drive or drink alcohol with taking it.  If you have any worsening or changing symptoms please return for reevaluation.  I am concerned that your thyroid might be inflamed.  We are drawing some labs to investigate your thyroid.  The ibuprofen should help with the pain and inflammation.  If you develop any worsening symptoms including fever, increasing pain, redness, swelling of your throat, diarrhea, palpitations, sweating all the time and feeling very hot you should be seen immediately.

## 2023-03-25 NOTE — ED Triage Notes (Signed)
Pt reports RT shoulder pain for one week . Pt also reports she is sore in her neck and has a raised lump.
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
## 2023-03-26 LAB — T3, FREE: T3, Free: 2.7 pg/mL (ref 2.0–4.4)

## 2023-07-16 DIAGNOSIS — R519 Headache, unspecified: Secondary | ICD-10-CM | POA: Diagnosis not present

## 2023-07-16 DIAGNOSIS — F0781 Postconcussional syndrome: Secondary | ICD-10-CM | POA: Diagnosis not present

## 2023-08-14 DIAGNOSIS — E041 Nontoxic single thyroid nodule: Secondary | ICD-10-CM | POA: Diagnosis not present

## 2023-08-14 DIAGNOSIS — J45909 Unspecified asthma, uncomplicated: Secondary | ICD-10-CM | POA: Diagnosis not present

## 2023-08-16 DIAGNOSIS — Z21 Asymptomatic human immunodeficiency virus [HIV] infection status: Secondary | ICD-10-CM | POA: Diagnosis not present

## 2023-08-16 DIAGNOSIS — Z124 Encounter for screening for malignant neoplasm of cervix: Secondary | ICD-10-CM | POA: Diagnosis not present

## 2023-08-16 DIAGNOSIS — N814 Uterovaginal prolapse, unspecified: Secondary | ICD-10-CM | POA: Diagnosis not present

## 2023-08-16 DIAGNOSIS — M48061 Spinal stenosis, lumbar region without neurogenic claudication: Secondary | ICD-10-CM | POA: Diagnosis not present

## 2023-08-16 DIAGNOSIS — Z1239 Encounter for other screening for malignant neoplasm of breast: Secondary | ICD-10-CM | POA: Diagnosis not present

## 2023-08-16 DIAGNOSIS — Z0000 Encounter for general adult medical examination without abnormal findings: Secondary | ICD-10-CM | POA: Diagnosis not present

## 2023-08-21 ENCOUNTER — Ambulatory Visit (HOSPITAL_COMMUNITY)
Admission: RE | Admit: 2023-08-21 | Discharge: 2023-08-21 | Disposition: A | Discharge status: Home / Self Care | Payer: Medicaid Other | Source: Ambulatory Visit | Attending: Sports Medicine | Admitting: Sports Medicine

## 2023-08-21 ENCOUNTER — Encounter (HOSPITAL_COMMUNITY): Payer: Self-pay

## 2023-08-21 ENCOUNTER — Other Ambulatory Visit: Payer: Self-pay

## 2023-08-21 VITALS — BP 153/65 | HR 99 | Temp 98.1°F | Resp 24

## 2023-08-21 DIAGNOSIS — J988 Other specified respiratory disorders: Secondary | ICD-10-CM

## 2023-08-21 DIAGNOSIS — M25511 Pain in right shoulder: Secondary | ICD-10-CM

## 2023-08-21 DIAGNOSIS — G8929 Other chronic pain: Secondary | ICD-10-CM | POA: Diagnosis not present

## 2023-08-21 DIAGNOSIS — B9789 Other viral agents as the cause of diseases classified elsewhere: Secondary | ICD-10-CM

## 2023-08-21 LAB — POC COVID19/FLU A&B COMBO
Covid Antigen, POC: NEGATIVE
Influenza A Antigen, POC: NEGATIVE
Influenza B Antigen, POC: NEGATIVE

## 2023-08-21 MED ORDER — METHYLPREDNISOLONE ACETATE 40 MG/ML IJ SUSP
40.0000 mg | Freq: Once | INTRAMUSCULAR | Status: AC
  Administered 2023-08-21: 40 mg via INTRA_ARTICULAR

## 2023-08-21 MED ORDER — LIDOCAINE HCL (PF) 1 % IJ SOLN
INTRAMUSCULAR | Status: AC
  Filled 2023-08-21: qty 30

## 2023-08-21 MED ORDER — LIDOCAINE HCL (PF) 1 % IJ SOLN
3.0000 mL | Freq: Once | INTRAMUSCULAR | Status: AC
  Administered 2023-08-21: 3 mL

## 2023-08-21 MED ORDER — METHYLPREDNISOLONE ACETATE 40 MG/ML IJ SUSP
INTRAMUSCULAR | Status: AC
  Filled 2023-08-21: qty 1

## 2023-08-21 NOTE — ED Notes (Signed)
Larey Seat in the last month due to leg giving away

## 2023-08-21 NOTE — ED Triage Notes (Addendum)
Right shoulder pain.  Pain for a month and getting worse. Shoulder has been a chronic issue and has been seen for this issue in the past.  Patient is right handed.    Wants a covid test.  Reports a covid exposure 3 days ago.  Complains of headache, sore throat, cough, and nausea.  Symptoms started yesterday  Patient has taken tylenol for headache

## 2023-08-21 NOTE — ED Provider Notes (Signed)
[facility]    CSN: [id number] Arrival date & time: 08/21/23  6578      History   Chief Complaint Chief Complaint  Patient presents with   Shoulder Pain    Covid test as well as pain in shoulder - Entered by patient   Appointment    8:30    HPI [name] is a 57 y.o. female.   [name] is a RHD 57yo female here for evaluation of right shoulder pain. This is a chronic issue for her. She has followed with Dr. Aundria Rud at [facility] for this for the past 2 years. She has a known AC joint separation and previously identified RTC partial thickness tear on MRI. She has had subacromial injections in the past that have been helpful. She has tried NSAIDs, tylenol, and muscle relaxer for the shoulder without benefit recently. She states there hasn't been a new injury though pain and mobility are worse over the past month. 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  Reports a covid exposure 3 days ago.  Complains of headache, sore throat, cough, and nausea.  Symptoms started yesterday. Patient has taken tylenol for headache.   The history is provided by the patient.  Shoulder Pain   Past Medical History:  Diagnosis Date   Asthma    GERD (gastroesophageal reflux disease)     Patient Active Problem List   Diagnosis Date Noted   HIV positive (HCC) 12/13/2019   Asthma, chronic 12/13/2019   Environmental allergies 12/13/2019   Spondylolisthesis at L5-S1 level 12/13/2019    Past Surgical History:  Procedure Laterality Date   ABDOMINAL HYSTERECTOMY      OB History   No obstetric history on file.      Home Medications    Prior to Admission medications   Medication Sig Start Date End Date Taking? Authorizing Provider  abacavir-dolutegravir-lamiVUDine (TRIUMEQ) 600-50-300 MG tablet Take by mouth. 11/29/16   [provider]  albuterol (PROVENTIL) (2.5 MG/3ML) 0.083%  nebulizer solution Take 3 mLs (2.5 mg total) by nebulization every 4 (four) hours as needed. For shortness of breath 11/15/22   Piontek, Denny Peon, MD  albuterol (VENTOLIN HFA) 108 (90 Base) MCG/ACT inhaler Inhale 2 puffs into the lungs every 4 (four) hours as needed for wheezing or shortness of breath. 06/18/22   Zenia Resides, MD  fluticasone (FLOVENT HFA) 110 MCG/ACT inhaler Inhale 2 puffs into the lungs 2 (two) times daily. 07/22/21   Particia Nearing, PA-C  Fluticasone-Salmeterol (ADVAIR) 250-50 MCG/DOSE AEPB Inhale 1 puff into the lungs every 12 (twelve) hours. 08/11/19   Domenick Gong, MD  ibuprofen (ADVIL) 800 MG tablet Take 1 tablet (800 mg total) by mouth 3 (three) times daily. 03/25/23   Raspet, Noberto Retort, PA-C  methocarbamol (ROBAXIN) 500 MG tablet Take 1 tablet (500 mg total) by mouth 2 (two) times daily. Patient not taking: Reported on 08/21/2023 03/25/23   Raspet, Noberto Retort, PA-C  Spacer/Aero-Holding Chambers (AEROCHAMBER PLUS) inhaler Use as instructed 08/11/19   Domenick Gong, MD    Family History History reviewed. No pertinent family history.  Social History Social History   Tobacco Use   Smoking status: Never   Smokeless tobacco: Never  Vaping Use   Vaping status: Never Used  Substance Use Topics   Alcohol use: No   Drug use: No     Allergies   Patient has no active allergies.   Review  of Systems Review of Systems   Physical Exam Triage Vital Signs ED Triage Vitals [08/21/23 0858]  Encounter Vitals Group     BP      Systolic BP Percentile      Diastolic BP Percentile      Pulse      Resp      Temp      Temp src      SpO2      Weight      Height      Head Circumference      Peak Flow      Pain Score 9     Pain Loc      Pain Education      Exclude from Growth Chart    No data found.  Updated Vital Signs BP (!) 153/65 (BP Location: Left Arm)   Pulse 99   Temp 98.1 F (36.7 C) (Oral)   Resp (!) 24 Comment: coughing  SpO2 97%   Visual  Acuity Right Eye Distance:   Left Eye Distance:   Bilateral Distance:    Right Eye Near:   Left Eye Near:    Bilateral Near:     Physical Exam Constitutional:      General: She is not in acute distress.    Appearance: Normal appearance. She is not ill-appearing.  HENT:     Head: Normocephalic and atraumatic.     Nose: Nose normal.     Mouth/Throat:     Mouth: Mucous membranes are moist.     Pharynx: Oropharynx is clear.  Eyes:     Extraocular Movements: Extraocular movements intact.     Conjunctiva/sclera: Conjunctivae normal.     Pupils: Pupils are equal, round, and reactive to light.  Cardiovascular:     Rate and Rhythm: Normal rate and regular rhythm.     Pulses: Normal pulses.  Pulmonary:     Effort: Pulmonary effort is normal.     Breath sounds: Normal breath sounds. No stridor. No wheezing, rhonchi or rales.  Musculoskeletal:     Comments: Right shoulder MSK Exam: No swelling, ecchymoses.  No gross deformity. TTP over [facility] joint and bicepital groove. Otherwise nontender. ROM greatly reduced and guarded. AROM Flexion to 60d, ER to 45d, IR to iliac crest. Unable to do more with PROM d/t guarding. Positive Neers, unable to perform Hawkins d/t pain with ROM. Positive Yergasons. Strength 4/5 resisted internal/external rotation, unable to perform empty can. NV intact distally.   Neurological:     Mental Status: She is alert.      UC Treatments / Results  Labs (all labs ordered are listed, but only abnormal results are displayed) Labs Reviewed  POC COVID19/FLU A&B COMBO    EKG   Radiology No results found.  Procedures Procedures (including critical care time) Right Subacromial Injection Procedure: After informed written consent timeout was performed, patient was in seated position on exam table.  Right shoulder was prepped with alcohol swab x2. Ethyl chloride spray used for topical anesthetic. Utilizing the posterior approach and a 25g needle, the right  subacromial bursa was injected with 3:1 lidocaine:depomedrol.  Following the injection a bandage was applied to the area. Patient tolerated procedure well without immediate complications. The patient was counseled as to the expected post-injection course, including the possibility of worsening of pain with steroid flare. Instructed as to concerning symptoms and advised to contact the office if these should arise.  Medications Ordered in UC Medications  methylPREDNISolone acetate (DEPO-MEDROL) injection 40  mg (40 mg Intra-articular Given 08/21/23 0932)  lidocaine (PF) (XYLOCAINE) 1 % injection 3 mL (3 mLs Other Given 08/21/23 0932)    Initial Impression / Assessment and Plan / UC Course  I have reviewed the triage vital signs and the nursing notes.  Pertinent labs & imaging results that were available during my care of the patient were reviewed by me and considered in my medical decision making (see chart for details).    Vitals and triage reviewed, patient is hemodynamically stable.  She has longstanding right shoulder pain secondary to [facility] joint separation and rotator cuff partial-thickness tearing.  Her physical exam is very guarded today and she does have symptoms consistent with impingement syndrome so we discussed trying a subacromial bursa injection with cortisone to see if this will help from a pain and mobility standpoint.  I did strongly recommend that she follow-up with Dr. Aundria Rud at [facility] for further evaluation as I do have concern for fairly significant cuff tearing though unable to fully evaluate based on her exam.  She will likely need another MRI of the right shoulder to further evaluate this.  Patient tolerated the procedure well today.  We reviewed normal aftercare measures.  Regarding her cough, headache, and sore throat I do think this is consistent with a viral respiratory faction.  Point-of-care COVID and flu antigen was performed and negative. Supportive care and return  precautions reviewed.  Final Clinical Impressions(s) / UC Diagnoses   Final diagnoses:  Chronic right shoulder pain  Viral respiratory infection     Discharge Instructions      You were seen for chronic pain in the right shoulder. We did an injection for this as discussed below. I recommend that you call and follow-up with Dr. Aundria Rud for this in the next 3-4 weeks. You also were tested for COVID and Flu which were both negative.  Today you received an injection with a corticosteroid (aka: cortisone injection). This injection is usually done in response to pain and inflammation. There is some "numbing medicine" (Lidocaine) in the shot, so the injected area may be numb and feel really good for the next couple of hours. The numbing medicine usually wears off in 2-3 hours, and then your pain level may be back to where it was before the injection until the cortisone starts working.    The actually benefit from the steroid injection is usually noticed within 3-5 days, but may take up to 14 days. You may actually experience a small (as in 10%) INCREASE in pain in the first 24 hours---that is common.  Things to watch out for that you should contact us or a health care provider urgently would include: 1. Unusual (as in more than 10%) increase in pain 2. New fever > 101.5 3. New swelling or redness of the injected area. 4. Streaking of red lines around the area injected.  Do not hesitate to call or reach out with any questions or concerns.     ED Prescriptions   None    PDMP not reviewed this encounter.   Marisa Cyphers, MD 08/21/23 [phone]

## 2023-08-21 NOTE — Discharge Instructions (Addendum)
You were seen for chronic pain in the right shoulder. We did an injection for this as discussed below. I recommend that you call and follow-up with Dr. Aundria Rud for this in the next 3-4 weeks. You also were tested for COVID and Flu which were both negative.  Today you received an injection with a corticosteroid (aka: cortisone injection). This injection is usually done in response to pain and inflammation. There is some "numbing medicine" (Lidocaine) in the shot, so the injected area may be numb and feel really good for the next couple of hours. The numbing medicine usually wears off in 2-3 hours, and then your pain level may be back to where it was before the injection until the cortisone starts working.    The actually benefit from the steroid injection is usually noticed within 3-5 days, but may take up to 14 days. You may actually experience a small (as in 10%) INCREASE in pain in the first 24 hours---that is common.  Things to watch out for that you should contact us or a health care provider urgently would include: 1. Unusual (as in more than 10%) increase in pain 2. New fever > 101.5 3. New swelling or redness of the injected area. 4. Streaking of red lines around the area injected.  Do not hesitate to call or reach out with any questions or concerns.

## 2023-08-30 DIAGNOSIS — Z23 Encounter for immunization: Secondary | ICD-10-CM | POA: Diagnosis not present

## 2023-08-30 DIAGNOSIS — W540XXA Bitten by dog, initial encounter: Secondary | ICD-10-CM | POA: Diagnosis not present

## 2023-08-30 DIAGNOSIS — S81851A Open bite, right lower leg, initial encounter: Secondary | ICD-10-CM | POA: Diagnosis not present

## 2023-09-06 DIAGNOSIS — Z1231 Encounter for screening mammogram for malignant neoplasm of breast: Secondary | ICD-10-CM | POA: Diagnosis not present

## 2023-09-06 DIAGNOSIS — R92323 Mammographic fibroglandular density, bilateral breasts: Secondary | ICD-10-CM | POA: Diagnosis not present

## 2023-09-16 DIAGNOSIS — R519 Headache, unspecified: Secondary | ICD-10-CM | POA: Diagnosis not present

## 2023-09-16 DIAGNOSIS — F0781 Postconcussional syndrome: Secondary | ICD-10-CM | POA: Diagnosis not present

## 2023-09-18 DIAGNOSIS — E669 Obesity, unspecified: Secondary | ICD-10-CM | POA: Diagnosis not present

## 2023-09-18 DIAGNOSIS — M4317 Spondylolisthesis, lumbosacral region: Secondary | ICD-10-CM | POA: Diagnosis not present

## 2023-09-18 DIAGNOSIS — M25511 Pain in right shoulder: Secondary | ICD-10-CM | POA: Diagnosis not present

## 2023-09-18 DIAGNOSIS — G8929 Other chronic pain: Secondary | ICD-10-CM | POA: Diagnosis not present

## 2023-09-18 DIAGNOSIS — Z981 Arthrodesis status: Secondary | ICD-10-CM | POA: Diagnosis not present

## 2023-09-18 DIAGNOSIS — M4310 Spondylolisthesis, site unspecified: Secondary | ICD-10-CM | POA: Diagnosis not present

## 2023-09-18 DIAGNOSIS — M4802 Spinal stenosis, cervical region: Secondary | ICD-10-CM | POA: Diagnosis not present

## 2023-09-19 DIAGNOSIS — M67911 Unspecified disorder of synovium and tendon, right shoulder: Secondary | ICD-10-CM | POA: Diagnosis not present

## 2023-09-19 DIAGNOSIS — M19011 Primary osteoarthritis, right shoulder: Secondary | ICD-10-CM | POA: Diagnosis not present

## 2023-09-19 DIAGNOSIS — M24111 Other articular cartilage disorders, right shoulder: Secondary | ICD-10-CM | POA: Diagnosis not present

## 2023-09-25 DIAGNOSIS — M4317 Spondylolisthesis, lumbosacral region: Secondary | ICD-10-CM | POA: Diagnosis not present

## 2023-09-25 DIAGNOSIS — M4802 Spinal stenosis, cervical region: Secondary | ICD-10-CM | POA: Diagnosis not present

## 2023-09-25 DIAGNOSIS — M4310 Spondylolisthesis, site unspecified: Secondary | ICD-10-CM | POA: Diagnosis not present

## 2023-09-25 DIAGNOSIS — Z981 Arthrodesis status: Secondary | ICD-10-CM | POA: Diagnosis not present

## 2023-10-04 DIAGNOSIS — E041 Nontoxic single thyroid nodule: Secondary | ICD-10-CM | POA: Diagnosis not present

## 2023-10-17 DIAGNOSIS — N952 Postmenopausal atrophic vaginitis: Secondary | ICD-10-CM | POA: Diagnosis not present

## 2023-10-17 DIAGNOSIS — N8111 Cystocele, midline: Secondary | ICD-10-CM | POA: Diagnosis not present

## 2023-10-17 DIAGNOSIS — R399 Unspecified symptoms and signs involving the genitourinary system: Secondary | ICD-10-CM | POA: Diagnosis not present

## 2023-10-17 DIAGNOSIS — R8281 Pyuria: Secondary | ICD-10-CM | POA: Diagnosis not present

## 2023-10-21 DIAGNOSIS — Z1211 Encounter for screening for malignant neoplasm of colon: Secondary | ICD-10-CM | POA: Diagnosis not present

## 2023-10-30 DIAGNOSIS — M4316 Spondylolisthesis, lumbar region: Secondary | ICD-10-CM | POA: Diagnosis not present

## 2023-10-30 DIAGNOSIS — M51379 Other intervertebral disc degeneration, lumbosacral region without mention of lumbar back pain or lower extremity pain: Secondary | ICD-10-CM | POA: Diagnosis not present

## 2023-10-30 DIAGNOSIS — M4312 Spondylolisthesis, cervical region: Secondary | ICD-10-CM | POA: Diagnosis not present

## 2023-10-30 DIAGNOSIS — M4310 Spondylolisthesis, site unspecified: Secondary | ICD-10-CM | POA: Diagnosis not present

## 2023-10-30 DIAGNOSIS — M4802 Spinal stenosis, cervical region: Secondary | ICD-10-CM | POA: Diagnosis not present

## 2023-10-30 DIAGNOSIS — M25511 Pain in right shoulder: Secondary | ICD-10-CM | POA: Diagnosis not present

## 2023-10-30 DIAGNOSIS — Z981 Arthrodesis status: Secondary | ICD-10-CM | POA: Diagnosis not present

## 2023-10-30 DIAGNOSIS — M5031 Other cervical disc degeneration,  high cervical region: Secondary | ICD-10-CM | POA: Diagnosis not present

## 2023-10-30 DIAGNOSIS — G8929 Other chronic pain: Secondary | ICD-10-CM | POA: Diagnosis not present

## 2023-10-30 DIAGNOSIS — M4317 Spondylolisthesis, lumbosacral region: Secondary | ICD-10-CM | POA: Diagnosis not present

## 2023-10-30 DIAGNOSIS — E669 Obesity, unspecified: Secondary | ICD-10-CM | POA: Diagnosis not present

## 2023-10-30 DIAGNOSIS — M50321 Other cervical disc degeneration at C4-C5 level: Secondary | ICD-10-CM | POA: Diagnosis not present

## 2023-10-30 DIAGNOSIS — M50323 Other cervical disc degeneration at C6-C7 level: Secondary | ICD-10-CM | POA: Diagnosis not present

## 2023-10-30 DIAGNOSIS — M47816 Spondylosis without myelopathy or radiculopathy, lumbar region: Secondary | ICD-10-CM | POA: Diagnosis not present

## 2023-11-11 DIAGNOSIS — E559 Vitamin D deficiency, unspecified: Secondary | ICD-10-CM | POA: Diagnosis not present

## 2023-11-11 DIAGNOSIS — M898X9 Other specified disorders of bone, unspecified site: Secondary | ICD-10-CM | POA: Diagnosis not present

## 2023-11-11 DIAGNOSIS — B351 Tinea unguium: Secondary | ICD-10-CM | POA: Diagnosis not present

## 2023-12-03 DIAGNOSIS — M810 Age-related osteoporosis without current pathological fracture: Secondary | ICD-10-CM | POA: Diagnosis not present

## 2023-12-05 DIAGNOSIS — B351 Tinea unguium: Secondary | ICD-10-CM | POA: Diagnosis not present

## 2023-12-05 DIAGNOSIS — L603 Nail dystrophy: Secondary | ICD-10-CM | POA: Diagnosis not present

## 2023-12-09 DIAGNOSIS — M79622 Pain in left upper arm: Secondary | ICD-10-CM | POA: Diagnosis not present

## 2023-12-09 DIAGNOSIS — S52502A Unspecified fracture of the lower end of left radius, initial encounter for closed fracture: Secondary | ICD-10-CM | POA: Diagnosis not present

## 2023-12-09 DIAGNOSIS — M546 Pain in thoracic spine: Secondary | ICD-10-CM | POA: Diagnosis not present

## 2023-12-09 DIAGNOSIS — R55 Syncope and collapse: Secondary | ICD-10-CM | POA: Diagnosis not present

## 2023-12-09 DIAGNOSIS — M25511 Pain in right shoulder: Secondary | ICD-10-CM | POA: Diagnosis not present

## 2023-12-09 DIAGNOSIS — Z043 Encounter for examination and observation following other accident: Secondary | ICD-10-CM | POA: Diagnosis not present

## 2023-12-09 DIAGNOSIS — F0781 Postconcussional syndrome: Secondary | ICD-10-CM | POA: Diagnosis not present

## 2023-12-09 DIAGNOSIS — M25512 Pain in left shoulder: Secondary | ICD-10-CM | POA: Diagnosis not present

## 2023-12-09 DIAGNOSIS — M25531 Pain in right wrist: Secondary | ICD-10-CM | POA: Diagnosis not present

## 2023-12-09 DIAGNOSIS — M79631 Pain in right forearm: Secondary | ICD-10-CM | POA: Diagnosis not present

## 2023-12-09 DIAGNOSIS — R079 Chest pain, unspecified: Secondary | ICD-10-CM | POA: Diagnosis not present

## 2023-12-09 DIAGNOSIS — M4800 Spinal stenosis, site unspecified: Secondary | ICD-10-CM | POA: Diagnosis not present

## 2023-12-09 DIAGNOSIS — R519 Headache, unspecified: Secondary | ICD-10-CM | POA: Diagnosis not present

## 2023-12-09 DIAGNOSIS — M25562 Pain in left knee: Secondary | ICD-10-CM | POA: Diagnosis not present

## 2023-12-09 DIAGNOSIS — W19XXXA Unspecified fall, initial encounter: Secondary | ICD-10-CM | POA: Diagnosis not present

## 2023-12-09 DIAGNOSIS — S199XXA Unspecified injury of neck, initial encounter: Secondary | ICD-10-CM | POA: Diagnosis not present

## 2023-12-09 DIAGNOSIS — I10 Essential (primary) hypertension: Secondary | ICD-10-CM | POA: Diagnosis not present

## 2023-12-09 DIAGNOSIS — M25519 Pain in unspecified shoulder: Secondary | ICD-10-CM | POA: Diagnosis not present

## 2023-12-09 DIAGNOSIS — M79641 Pain in right hand: Secondary | ICD-10-CM | POA: Diagnosis not present

## 2023-12-09 DIAGNOSIS — M79632 Pain in left forearm: Secondary | ICD-10-CM | POA: Diagnosis not present

## 2023-12-09 DIAGNOSIS — M25561 Pain in right knee: Secondary | ICD-10-CM | POA: Diagnosis not present

## 2023-12-09 DIAGNOSIS — S52572A Other intraarticular fracture of lower end of left radius, initial encounter for closed fracture: Secondary | ICD-10-CM | POA: Diagnosis not present

## 2023-12-09 DIAGNOSIS — M79642 Pain in left hand: Secondary | ICD-10-CM | POA: Diagnosis not present

## 2023-12-16 DIAGNOSIS — S52572A Other intraarticular fracture of lower end of left radius, initial encounter for closed fracture: Secondary | ICD-10-CM | POA: Diagnosis not present

## 2024-01-06 DIAGNOSIS — S52572A Other intraarticular fracture of lower end of left radius, initial encounter for closed fracture: Secondary | ICD-10-CM | POA: Diagnosis not present

## 2024-01-06 DIAGNOSIS — S52572D Other intraarticular fracture of lower end of left radius, subsequent encounter for closed fracture with routine healing: Secondary | ICD-10-CM | POA: Diagnosis not present

## 2024-01-08 DIAGNOSIS — M542 Cervicalgia: Secondary | ICD-10-CM | POA: Diagnosis not present

## 2024-01-08 DIAGNOSIS — M5442 Lumbago with sciatica, left side: Secondary | ICD-10-CM | POA: Diagnosis not present

## 2024-01-08 DIAGNOSIS — M5441 Lumbago with sciatica, right side: Secondary | ICD-10-CM | POA: Diagnosis not present

## 2024-01-08 DIAGNOSIS — G8929 Other chronic pain: Secondary | ICD-10-CM | POA: Diagnosis not present

## 2024-01-15 DIAGNOSIS — B20 Human immunodeficiency virus [HIV] disease: Secondary | ICD-10-CM | POA: Diagnosis not present

## 2024-01-15 DIAGNOSIS — Z79899 Other long term (current) drug therapy: Secondary | ICD-10-CM | POA: Diagnosis not present

## 2024-01-16 DIAGNOSIS — B351 Tinea unguium: Secondary | ICD-10-CM | POA: Diagnosis not present

## 2024-02-03 DIAGNOSIS — S52572D Other intraarticular fracture of lower end of left radius, subsequent encounter for closed fracture with routine healing: Secondary | ICD-10-CM | POA: Diagnosis not present

## 2024-02-03 DIAGNOSIS — S52572S Other intraarticular fracture of lower end of left radius, sequela: Secondary | ICD-10-CM | POA: Diagnosis not present

## 2024-02-03 DIAGNOSIS — M25642 Stiffness of left hand, not elsewhere classified: Secondary | ICD-10-CM | POA: Diagnosis not present

## 2024-03-02 DIAGNOSIS — M25532 Pain in left wrist: Secondary | ICD-10-CM | POA: Diagnosis not present

## 2024-03-02 DIAGNOSIS — M25642 Stiffness of left hand, not elsewhere classified: Secondary | ICD-10-CM | POA: Diagnosis not present

## 2024-03-02 DIAGNOSIS — S52572D Other intraarticular fracture of lower end of left radius, subsequent encounter for closed fracture with routine healing: Secondary | ICD-10-CM | POA: Diagnosis not present

## 2024-03-02 DIAGNOSIS — S52515A Nondisplaced fracture of left radial styloid process, initial encounter for closed fracture: Secondary | ICD-10-CM | POA: Diagnosis not present

## 2024-03-02 DIAGNOSIS — S60212A Contusion of left wrist, initial encounter: Secondary | ICD-10-CM | POA: Diagnosis not present

## 2024-03-02 DIAGNOSIS — S52572A Other intraarticular fracture of lower end of left radius, initial encounter for closed fracture: Secondary | ICD-10-CM | POA: Diagnosis not present

## 2024-03-13 DIAGNOSIS — M542 Cervicalgia: Secondary | ICD-10-CM | POA: Diagnosis not present

## 2024-03-13 DIAGNOSIS — M7989 Other specified soft tissue disorders: Secondary | ICD-10-CM | POA: Diagnosis not present

## 2024-03-13 DIAGNOSIS — M79642 Pain in left hand: Secondary | ICD-10-CM | POA: Diagnosis not present

## 2024-03-13 DIAGNOSIS — R202 Paresthesia of skin: Secondary | ICD-10-CM | POA: Diagnosis not present

## 2024-03-17 DIAGNOSIS — M25511 Pain in right shoulder: Secondary | ICD-10-CM | POA: Diagnosis not present

## 2024-03-23 DIAGNOSIS — M25532 Pain in left wrist: Secondary | ICD-10-CM | POA: Diagnosis not present

## 2024-03-23 DIAGNOSIS — S52515A Nondisplaced fracture of left radial styloid process, initial encounter for closed fracture: Secondary | ICD-10-CM | POA: Diagnosis not present

## 2024-03-23 DIAGNOSIS — S60212A Contusion of left wrist, initial encounter: Secondary | ICD-10-CM | POA: Diagnosis not present

## 2024-03-30 DIAGNOSIS — M792 Neuralgia and neuritis, unspecified: Secondary | ICD-10-CM | POA: Diagnosis not present

## 2024-04-01 DIAGNOSIS — M5441 Lumbago with sciatica, right side: Secondary | ICD-10-CM | POA: Diagnosis not present

## 2024-04-01 DIAGNOSIS — M5442 Lumbago with sciatica, left side: Secondary | ICD-10-CM | POA: Diagnosis not present

## 2024-04-01 DIAGNOSIS — M542 Cervicalgia: Secondary | ICD-10-CM | POA: Diagnosis not present

## 2024-04-01 DIAGNOSIS — G8929 Other chronic pain: Secondary | ICD-10-CM | POA: Diagnosis not present

## 2024-04-08 DIAGNOSIS — M4310 Spondylolisthesis, site unspecified: Secondary | ICD-10-CM | POA: Diagnosis not present

## 2024-04-08 DIAGNOSIS — M4317 Spondylolisthesis, lumbosacral region: Secondary | ICD-10-CM | POA: Diagnosis not present

## 2024-04-08 DIAGNOSIS — J029 Acute pharyngitis, unspecified: Secondary | ICD-10-CM | POA: Diagnosis not present

## 2024-04-08 DIAGNOSIS — J069 Acute upper respiratory infection, unspecified: Secondary | ICD-10-CM | POA: Diagnosis not present

## 2024-04-08 DIAGNOSIS — M810 Age-related osteoporosis without current pathological fracture: Secondary | ICD-10-CM | POA: Diagnosis not present

## 2024-04-08 DIAGNOSIS — Z981 Arthrodesis status: Secondary | ICD-10-CM | POA: Diagnosis not present

## 2024-04-14 DIAGNOSIS — M818 Other osteoporosis without current pathological fracture: Secondary | ICD-10-CM | POA: Diagnosis not present

## 2024-04-14 DIAGNOSIS — Z8781 Personal history of (healed) traumatic fracture: Secondary | ICD-10-CM | POA: Diagnosis not present

## 2024-04-14 DIAGNOSIS — E559 Vitamin D deficiency, unspecified: Secondary | ICD-10-CM | POA: Diagnosis not present

## 2024-04-14 DIAGNOSIS — R296 Repeated falls: Secondary | ICD-10-CM | POA: Diagnosis not present

## 2024-04-14 DIAGNOSIS — Z79899 Other long term (current) drug therapy: Secondary | ICD-10-CM | POA: Diagnosis not present

## 2024-04-20 DIAGNOSIS — M542 Cervicalgia: Secondary | ICD-10-CM | POA: Diagnosis not present

## 2024-04-20 DIAGNOSIS — G959 Disease of spinal cord, unspecified: Secondary | ICD-10-CM | POA: Diagnosis not present

## 2024-04-20 DIAGNOSIS — Z4789 Encounter for other orthopedic aftercare: Secondary | ICD-10-CM | POA: Diagnosis not present

## 2024-04-23 DIAGNOSIS — M4726 Other spondylosis with radiculopathy, lumbar region: Secondary | ICD-10-CM | POA: Diagnosis not present

## 2024-04-23 DIAGNOSIS — M5116 Intervertebral disc disorders with radiculopathy, lumbar region: Secondary | ICD-10-CM | POA: Diagnosis not present

## 2024-04-23 DIAGNOSIS — M4317 Spondylolisthesis, lumbosacral region: Secondary | ICD-10-CM | POA: Diagnosis not present

## 2024-04-23 DIAGNOSIS — M4727 Other spondylosis with radiculopathy, lumbosacral region: Secondary | ICD-10-CM | POA: Diagnosis not present

## 2024-04-23 DIAGNOSIS — M47816 Spondylosis without myelopathy or radiculopathy, lumbar region: Secondary | ICD-10-CM | POA: Diagnosis not present

## 2024-04-23 DIAGNOSIS — M47817 Spondylosis without myelopathy or radiculopathy, lumbosacral region: Secondary | ICD-10-CM | POA: Diagnosis not present

## 2024-04-23 DIAGNOSIS — M5126 Other intervertebral disc displacement, lumbar region: Secondary | ICD-10-CM | POA: Diagnosis not present

## 2024-04-23 DIAGNOSIS — M4807 Spinal stenosis, lumbosacral region: Secondary | ICD-10-CM | POA: Diagnosis not present

## 2024-04-24 DIAGNOSIS — S52515A Nondisplaced fracture of left radial styloid process, initial encounter for closed fracture: Secondary | ICD-10-CM | POA: Diagnosis not present

## 2024-04-24 DIAGNOSIS — M79642 Pain in left hand: Secondary | ICD-10-CM | POA: Diagnosis not present

## 2024-04-25 DIAGNOSIS — R600 Localized edema: Secondary | ICD-10-CM | POA: Diagnosis not present

## 2024-04-28 DIAGNOSIS — N3941 Urge incontinence: Secondary | ICD-10-CM | POA: Diagnosis not present

## 2024-04-28 DIAGNOSIS — N393 Stress incontinence (female) (male): Secondary | ICD-10-CM | POA: Diagnosis not present

## 2024-04-28 DIAGNOSIS — R3989 Other symptoms and signs involving the genitourinary system: Secondary | ICD-10-CM | POA: Diagnosis not present

## 2024-04-29 DIAGNOSIS — M542 Cervicalgia: Secondary | ICD-10-CM | POA: Diagnosis not present

## 2024-04-29 DIAGNOSIS — M4310 Spondylolisthesis, site unspecified: Secondary | ICD-10-CM | POA: Diagnosis not present

## 2024-04-29 DIAGNOSIS — M4317 Spondylolisthesis, lumbosacral region: Secondary | ICD-10-CM | POA: Diagnosis not present

## 2024-05-07 DIAGNOSIS — R519 Headache, unspecified: Secondary | ICD-10-CM | POA: Diagnosis not present

## 2024-05-16 DIAGNOSIS — M4802 Spinal stenosis, cervical region: Secondary | ICD-10-CM | POA: Diagnosis not present

## 2024-05-16 DIAGNOSIS — M5030 Other cervical disc degeneration, unspecified cervical region: Secondary | ICD-10-CM | POA: Diagnosis not present

## 2024-05-26 DIAGNOSIS — Z9889 Other specified postprocedural states: Secondary | ICD-10-CM | POA: Diagnosis not present

## 2024-05-26 DIAGNOSIS — M5412 Radiculopathy, cervical region: Secondary | ICD-10-CM | POA: Diagnosis not present

## 2024-05-26 DIAGNOSIS — M542 Cervicalgia: Secondary | ICD-10-CM | POA: Diagnosis not present

## 2024-05-26 DIAGNOSIS — M4316 Spondylolisthesis, lumbar region: Secondary | ICD-10-CM | POA: Diagnosis not present

## 2024-05-26 DIAGNOSIS — M47812 Spondylosis without myelopathy or radiculopathy, cervical region: Secondary | ICD-10-CM | POA: Diagnosis not present

## 2024-05-27 DIAGNOSIS — M792 Neuralgia and neuritis, unspecified: Secondary | ICD-10-CM | POA: Diagnosis not present

## 2024-05-27 DIAGNOSIS — R202 Paresthesia of skin: Secondary | ICD-10-CM | POA: Diagnosis not present

## 2024-05-28 DIAGNOSIS — Z79899 Other long term (current) drug therapy: Secondary | ICD-10-CM | POA: Diagnosis not present

## 2024-05-28 DIAGNOSIS — M818 Other osteoporosis without current pathological fracture: Secondary | ICD-10-CM | POA: Diagnosis not present

## 2024-05-28 DIAGNOSIS — E559 Vitamin D deficiency, unspecified: Secondary | ICD-10-CM | POA: Diagnosis not present

## 2024-06-09 DIAGNOSIS — M5459 Other low back pain: Secondary | ICD-10-CM | POA: Diagnosis not present

## 2024-06-09 DIAGNOSIS — M7918 Myalgia, other site: Secondary | ICD-10-CM | POA: Diagnosis not present

## 2024-06-09 DIAGNOSIS — M4722 Other spondylosis with radiculopathy, cervical region: Secondary | ICD-10-CM | POA: Diagnosis not present

## 2024-06-09 DIAGNOSIS — M47816 Spondylosis without myelopathy or radiculopathy, lumbar region: Secondary | ICD-10-CM | POA: Diagnosis not present

## 2024-06-09 DIAGNOSIS — M4802 Spinal stenosis, cervical region: Secondary | ICD-10-CM | POA: Diagnosis not present

## 2024-06-09 DIAGNOSIS — M47812 Spondylosis without myelopathy or radiculopathy, cervical region: Secondary | ICD-10-CM | POA: Diagnosis not present

## 2024-06-09 DIAGNOSIS — G894 Chronic pain syndrome: Secondary | ICD-10-CM | POA: Diagnosis not present

## 2024-06-09 DIAGNOSIS — M4316 Spondylolisthesis, lumbar region: Secondary | ICD-10-CM | POA: Diagnosis not present

## 2024-06-09 DIAGNOSIS — M4726 Other spondylosis with radiculopathy, lumbar region: Secondary | ICD-10-CM | POA: Diagnosis not present

## 2024-06-09 DIAGNOSIS — M5030 Other cervical disc degeneration, unspecified cervical region: Secondary | ICD-10-CM | POA: Diagnosis not present

## 2024-06-10 DIAGNOSIS — R7989 Other specified abnormal findings of blood chemistry: Secondary | ICD-10-CM | POA: Diagnosis not present

## 2024-06-23 DIAGNOSIS — M48061 Spinal stenosis, lumbar region without neurogenic claudication: Secondary | ICD-10-CM | POA: Diagnosis not present

## 2024-06-23 DIAGNOSIS — M47816 Spondylosis without myelopathy or radiculopathy, lumbar region: Secondary | ICD-10-CM | POA: Diagnosis not present

## 2024-07-09 DIAGNOSIS — M5416 Radiculopathy, lumbar region: Secondary | ICD-10-CM | POA: Diagnosis not present

## 2024-07-09 DIAGNOSIS — M533 Sacrococcygeal disorders, not elsewhere classified: Secondary | ICD-10-CM | POA: Diagnosis not present

## 2024-07-16 DIAGNOSIS — Z79899 Other long term (current) drug therapy: Secondary | ICD-10-CM | POA: Diagnosis not present

## 2024-07-16 DIAGNOSIS — M818 Other osteoporosis without current pathological fracture: Secondary | ICD-10-CM | POA: Diagnosis not present

## 2024-07-16 DIAGNOSIS — E559 Vitamin D deficiency, unspecified: Secondary | ICD-10-CM | POA: Diagnosis not present

## 2024-07-20 DIAGNOSIS — B351 Tinea unguium: Secondary | ICD-10-CM | POA: Diagnosis not present

## 2024-07-29 DIAGNOSIS — M533 Sacrococcygeal disorders, not elsewhere classified: Secondary | ICD-10-CM | POA: Diagnosis not present

## 2024-08-10 DIAGNOSIS — G43109 Migraine with aura, not intractable, without status migrainosus: Secondary | ICD-10-CM | POA: Diagnosis not present

## 2024-08-11 DIAGNOSIS — R8290 Unspecified abnormal findings in urine: Secondary | ICD-10-CM | POA: Diagnosis not present

## 2024-08-11 DIAGNOSIS — R300 Dysuria: Secondary | ICD-10-CM | POA: Diagnosis not present

## 2024-08-11 DIAGNOSIS — N3000 Acute cystitis without hematuria: Secondary | ICD-10-CM | POA: Diagnosis not present

## 2024-08-11 DIAGNOSIS — N3941 Urge incontinence: Secondary | ICD-10-CM | POA: Diagnosis not present

## 2024-08-14 DIAGNOSIS — M818 Other osteoporosis without current pathological fracture: Secondary | ICD-10-CM | POA: Diagnosis not present

## 2024-08-14 DIAGNOSIS — R296 Repeated falls: Secondary | ICD-10-CM | POA: Diagnosis not present

## 2024-08-14 DIAGNOSIS — Z0000 Encounter for general adult medical examination without abnormal findings: Secondary | ICD-10-CM | POA: Diagnosis not present

## 2024-08-14 DIAGNOSIS — Z124 Encounter for screening for malignant neoplasm of cervix: Secondary | ICD-10-CM | POA: Diagnosis not present

## 2024-08-14 DIAGNOSIS — Z21 Asymptomatic human immunodeficiency virus [HIV] infection status: Secondary | ICD-10-CM | POA: Diagnosis not present

## 2024-08-14 DIAGNOSIS — E559 Vitamin D deficiency, unspecified: Secondary | ICD-10-CM | POA: Diagnosis not present

## 2024-08-14 DIAGNOSIS — M4802 Spinal stenosis, cervical region: Secondary | ICD-10-CM | POA: Diagnosis not present

## 2024-08-14 DIAGNOSIS — G959 Disease of spinal cord, unspecified: Secondary | ICD-10-CM | POA: Diagnosis not present

## 2024-08-14 DIAGNOSIS — J45909 Unspecified asthma, uncomplicated: Secondary | ICD-10-CM | POA: Diagnosis not present

## 2024-08-14 DIAGNOSIS — Z9109 Other allergy status, other than to drugs and biological substances: Secondary | ICD-10-CM | POA: Diagnosis not present

## 2024-08-26 DIAGNOSIS — M5416 Radiculopathy, lumbar region: Secondary | ICD-10-CM | POA: Diagnosis not present

## 2024-08-26 DIAGNOSIS — M4316 Spondylolisthesis, lumbar region: Secondary | ICD-10-CM | POA: Diagnosis not present

## 2024-08-26 DIAGNOSIS — M5412 Radiculopathy, cervical region: Secondary | ICD-10-CM | POA: Diagnosis not present

## 2024-08-26 DIAGNOSIS — M5441 Lumbago with sciatica, right side: Secondary | ICD-10-CM | POA: Diagnosis not present

## 2024-08-26 DIAGNOSIS — Z9889 Other specified postprocedural states: Secondary | ICD-10-CM | POA: Diagnosis not present

## 2024-08-26 DIAGNOSIS — G8929 Other chronic pain: Secondary | ICD-10-CM | POA: Diagnosis not present

## 2024-08-26 DIAGNOSIS — M47812 Spondylosis without myelopathy or radiculopathy, cervical region: Secondary | ICD-10-CM | POA: Diagnosis not present

## 2024-08-26 DIAGNOSIS — M5442 Lumbago with sciatica, left side: Secondary | ICD-10-CM | POA: Diagnosis not present

## 2024-09-01 DIAGNOSIS — S6722XD Crushing injury of left hand, subsequent encounter: Secondary | ICD-10-CM | POA: Diagnosis not present

## 2024-09-01 DIAGNOSIS — S6722XA Crushing injury of left hand, initial encounter: Secondary | ICD-10-CM | POA: Diagnosis not present

## 2024-09-01 DIAGNOSIS — R202 Paresthesia of skin: Secondary | ICD-10-CM | POA: Diagnosis not present

## 2024-09-02 DIAGNOSIS — M545 Low back pain, unspecified: Secondary | ICD-10-CM | POA: Diagnosis not present

## 2024-09-02 DIAGNOSIS — R296 Repeated falls: Secondary | ICD-10-CM | POA: Diagnosis not present

## 2024-09-02 DIAGNOSIS — I602 Nontraumatic subarachnoid hemorrhage from anterior communicating artery: Secondary | ICD-10-CM | POA: Diagnosis not present

## 2024-09-02 DIAGNOSIS — E041 Nontoxic single thyroid nodule: Secondary | ICD-10-CM | POA: Diagnosis not present

## 2024-09-02 DIAGNOSIS — R221 Localized swelling, mass and lump, neck: Secondary | ICD-10-CM | POA: Diagnosis not present

## 2024-09-02 DIAGNOSIS — M47812 Spondylosis without myelopathy or radiculopathy, cervical region: Secondary | ICD-10-CM | POA: Diagnosis not present

## 2024-09-03 DIAGNOSIS — R200 Anesthesia of skin: Secondary | ICD-10-CM | POA: Diagnosis not present

## 2024-09-03 DIAGNOSIS — R202 Paresthesia of skin: Secondary | ICD-10-CM | POA: Diagnosis not present

## 2024-09-03 DIAGNOSIS — I602 Nontraumatic subarachnoid hemorrhage from anterior communicating artery: Secondary | ICD-10-CM | POA: Diagnosis not present

## 2024-09-03 DIAGNOSIS — I517 Cardiomegaly: Secondary | ICD-10-CM | POA: Diagnosis not present

## 2024-09-03 DIAGNOSIS — E041 Nontoxic single thyroid nodule: Secondary | ICD-10-CM | POA: Diagnosis not present

## 2024-09-03 DIAGNOSIS — G459 Transient cerebral ischemic attack, unspecified: Secondary | ICD-10-CM | POA: Diagnosis not present

## 2024-09-21 DIAGNOSIS — M4726 Other spondylosis with radiculopathy, lumbar region: Secondary | ICD-10-CM | POA: Diagnosis not present

## 2024-09-21 DIAGNOSIS — M47812 Spondylosis without myelopathy or radiculopathy, cervical region: Secondary | ICD-10-CM | POA: Diagnosis not present

## 2024-09-21 DIAGNOSIS — G894 Chronic pain syndrome: Secondary | ICD-10-CM | POA: Diagnosis not present

## 2024-09-21 DIAGNOSIS — M48061 Spinal stenosis, lumbar region without neurogenic claudication: Secondary | ICD-10-CM | POA: Diagnosis not present

## 2024-09-21 DIAGNOSIS — M51362 Other intervertebral disc degeneration, lumbar region with discogenic back pain and lower extremity pain: Secondary | ICD-10-CM | POA: Diagnosis not present

## 2024-09-21 DIAGNOSIS — M5030 Other cervical disc degeneration, unspecified cervical region: Secondary | ICD-10-CM | POA: Diagnosis not present

## 2024-09-28 DIAGNOSIS — R87619 Unspecified abnormal cytological findings in specimens from cervix uteri: Secondary | ICD-10-CM | POA: Diagnosis not present

## 2024-09-28 DIAGNOSIS — B977 Papillomavirus as the cause of diseases classified elsewhere: Secondary | ICD-10-CM | POA: Diagnosis not present

## 2024-09-28 DIAGNOSIS — R87618 Other abnormal cytological findings on specimens from cervix uteri: Secondary | ICD-10-CM | POA: Diagnosis not present

## 2024-10-01 DIAGNOSIS — M533 Sacrococcygeal disorders, not elsewhere classified: Secondary | ICD-10-CM | POA: Diagnosis not present
